# Patient Record
Sex: Male | Born: 1970
Health system: Southern US, Community
[De-identification: ages and names within clinical notes are randomized; demographics above are authoritative.]

## PROBLEM LIST (undated history)

## (undated) DIAGNOSIS — Z87442 Personal history of urinary calculi: Secondary | ICD-10-CM

## (undated) DIAGNOSIS — R079 Chest pain, unspecified: Secondary | ICD-10-CM

## (undated) DIAGNOSIS — N201 Calculus of ureter: Secondary | ICD-10-CM

## (undated) HISTORY — DX: Chest pain, unspecified: R07.9

## (undated) HISTORY — PX: CHOLECYSTECTOMY: SHX55

---

## 1993-07-18 HISTORY — PX: FOOT SURGERY: SHX648

## 2004-07-10 ENCOUNTER — Emergency Department: Payer: Self-pay | Admitting: Emergency Medicine

## 2006-07-18 HISTORY — PX: URETEROLITHOTOMY: SHX71

## 2007-07-04 ENCOUNTER — Ambulatory Visit: Payer: Self-pay | Admitting: Urology

## 2007-07-09 ENCOUNTER — Emergency Department: Payer: Self-pay | Admitting: Emergency Medicine

## 2007-07-11 ENCOUNTER — Ambulatory Visit: Payer: Self-pay | Admitting: Urology

## 2007-07-16 ENCOUNTER — Ambulatory Visit: Payer: Self-pay | Admitting: Urology

## 2013-12-16 ENCOUNTER — Other Ambulatory Visit: Payer: Self-pay | Admitting: Urology

## 2013-12-18 ENCOUNTER — Encounter (HOSPITAL_BASED_OUTPATIENT_CLINIC_OR_DEPARTMENT_OTHER): Payer: Self-pay | Admitting: *Deleted

## 2013-12-20 ENCOUNTER — Encounter (HOSPITAL_BASED_OUTPATIENT_CLINIC_OR_DEPARTMENT_OTHER): Payer: Self-pay | Admitting: *Deleted

## 2013-12-20 NOTE — Progress Notes (Signed)
NPO AFTER MN. ARRIVE AT 0600. NEEDS HG. MAY TAKE OXYCODONE IF NEEDED W/ SIPS OF WATER AM DOS.

## 2013-12-26 ENCOUNTER — Ambulatory Visit (HOSPITAL_BASED_OUTPATIENT_CLINIC_OR_DEPARTMENT_OTHER): Admission: RE | Admit: 2013-12-26 | Payer: BC Managed Care – PPO | Source: Ambulatory Visit | Admitting: Urology

## 2013-12-26 HISTORY — DX: Personal history of urinary calculi: Z87.442

## 2013-12-26 HISTORY — DX: Calculus of ureter: N20.1

## 2013-12-26 SURGERY — CYSTOURETEROSCOPY, WITH RETROGRADE PYELOGRAM AND STENT INSERTION
Anesthesia: General | Laterality: Right

## 2016-03-18 DIAGNOSIS — M5413 Radiculopathy, cervicothoracic region: Secondary | ICD-10-CM | POA: Diagnosis not present

## 2016-03-18 DIAGNOSIS — M9901 Segmental and somatic dysfunction of cervical region: Secondary | ICD-10-CM | POA: Diagnosis not present

## 2016-03-18 DIAGNOSIS — M9903 Segmental and somatic dysfunction of lumbar region: Secondary | ICD-10-CM | POA: Diagnosis not present

## 2016-03-18 DIAGNOSIS — M9902 Segmental and somatic dysfunction of thoracic region: Secondary | ICD-10-CM | POA: Diagnosis not present

## 2016-03-31 DIAGNOSIS — M9901 Segmental and somatic dysfunction of cervical region: Secondary | ICD-10-CM | POA: Diagnosis not present

## 2016-03-31 DIAGNOSIS — M9902 Segmental and somatic dysfunction of thoracic region: Secondary | ICD-10-CM | POA: Diagnosis not present

## 2016-03-31 DIAGNOSIS — M5413 Radiculopathy, cervicothoracic region: Secondary | ICD-10-CM | POA: Diagnosis not present

## 2016-03-31 DIAGNOSIS — M9903 Segmental and somatic dysfunction of lumbar region: Secondary | ICD-10-CM | POA: Diagnosis not present

## 2016-04-21 DIAGNOSIS — M9903 Segmental and somatic dysfunction of lumbar region: Secondary | ICD-10-CM | POA: Diagnosis not present

## 2016-04-21 DIAGNOSIS — M5413 Radiculopathy, cervicothoracic region: Secondary | ICD-10-CM | POA: Diagnosis not present

## 2016-04-21 DIAGNOSIS — M9901 Segmental and somatic dysfunction of cervical region: Secondary | ICD-10-CM | POA: Diagnosis not present

## 2016-04-21 DIAGNOSIS — M9902 Segmental and somatic dysfunction of thoracic region: Secondary | ICD-10-CM | POA: Diagnosis not present

## 2016-05-12 DIAGNOSIS — M9903 Segmental and somatic dysfunction of lumbar region: Secondary | ICD-10-CM | POA: Diagnosis not present

## 2016-05-12 DIAGNOSIS — M9902 Segmental and somatic dysfunction of thoracic region: Secondary | ICD-10-CM | POA: Diagnosis not present

## 2016-06-02 DIAGNOSIS — M9903 Segmental and somatic dysfunction of lumbar region: Secondary | ICD-10-CM | POA: Diagnosis not present

## 2016-06-02 DIAGNOSIS — M9902 Segmental and somatic dysfunction of thoracic region: Secondary | ICD-10-CM | POA: Diagnosis not present

## 2016-06-20 DIAGNOSIS — M546 Pain in thoracic spine: Secondary | ICD-10-CM | POA: Diagnosis not present

## 2016-06-20 DIAGNOSIS — M9902 Segmental and somatic dysfunction of thoracic region: Secondary | ICD-10-CM | POA: Diagnosis not present

## 2016-06-20 DIAGNOSIS — M9903 Segmental and somatic dysfunction of lumbar region: Secondary | ICD-10-CM | POA: Diagnosis not present

## 2016-07-14 DIAGNOSIS — M9903 Segmental and somatic dysfunction of lumbar region: Secondary | ICD-10-CM | POA: Diagnosis not present

## 2016-07-14 DIAGNOSIS — M9902 Segmental and somatic dysfunction of thoracic region: Secondary | ICD-10-CM | POA: Diagnosis not present

## 2016-07-14 DIAGNOSIS — M546 Pain in thoracic spine: Secondary | ICD-10-CM | POA: Diagnosis not present

## 2016-08-11 DIAGNOSIS — M9902 Segmental and somatic dysfunction of thoracic region: Secondary | ICD-10-CM | POA: Diagnosis not present

## 2016-08-11 DIAGNOSIS — M546 Pain in thoracic spine: Secondary | ICD-10-CM | POA: Diagnosis not present

## 2016-08-11 DIAGNOSIS — M9903 Segmental and somatic dysfunction of lumbar region: Secondary | ICD-10-CM | POA: Diagnosis not present

## 2016-09-08 DIAGNOSIS — M9902 Segmental and somatic dysfunction of thoracic region: Secondary | ICD-10-CM | POA: Diagnosis not present

## 2016-09-08 DIAGNOSIS — M9901 Segmental and somatic dysfunction of cervical region: Secondary | ICD-10-CM | POA: Diagnosis not present

## 2016-09-08 DIAGNOSIS — G44209 Tension-type headache, unspecified, not intractable: Secondary | ICD-10-CM | POA: Diagnosis not present

## 2016-09-08 DIAGNOSIS — M9903 Segmental and somatic dysfunction of lumbar region: Secondary | ICD-10-CM | POA: Diagnosis not present

## 2016-09-29 DIAGNOSIS — G44209 Tension-type headache, unspecified, not intractable: Secondary | ICD-10-CM | POA: Diagnosis not present

## 2016-09-29 DIAGNOSIS — M9902 Segmental and somatic dysfunction of thoracic region: Secondary | ICD-10-CM | POA: Diagnosis not present

## 2016-09-29 DIAGNOSIS — M9901 Segmental and somatic dysfunction of cervical region: Secondary | ICD-10-CM | POA: Diagnosis not present

## 2016-09-29 DIAGNOSIS — M9903 Segmental and somatic dysfunction of lumbar region: Secondary | ICD-10-CM | POA: Diagnosis not present

## 2016-10-27 DIAGNOSIS — M9901 Segmental and somatic dysfunction of cervical region: Secondary | ICD-10-CM | POA: Diagnosis not present

## 2016-10-27 DIAGNOSIS — M9902 Segmental and somatic dysfunction of thoracic region: Secondary | ICD-10-CM | POA: Diagnosis not present

## 2016-10-27 DIAGNOSIS — M9903 Segmental and somatic dysfunction of lumbar region: Secondary | ICD-10-CM | POA: Diagnosis not present

## 2016-12-16 HISTORY — PX: URETERAL STENT PLACEMENT: SHX822

## 2016-12-23 ENCOUNTER — Emergency Department
Admission: EM | Admit: 2016-12-23 | Discharge: 2016-12-23 | Disposition: A | Discharge status: Home / Self Care | Payer: BLUE CROSS/BLUE SHIELD | Attending: Emergency Medicine | Admitting: Emergency Medicine

## 2016-12-23 ENCOUNTER — Emergency Department: Payer: BLUE CROSS/BLUE SHIELD

## 2016-12-23 ENCOUNTER — Encounter: Payer: Self-pay | Admitting: Emergency Medicine

## 2016-12-23 DIAGNOSIS — Z87891 Personal history of nicotine dependence: Secondary | ICD-10-CM | POA: Diagnosis not present

## 2016-12-23 DIAGNOSIS — N200 Calculus of kidney: Secondary | ICD-10-CM | POA: Diagnosis not present

## 2016-12-23 DIAGNOSIS — R109 Unspecified abdominal pain: Secondary | ICD-10-CM | POA: Diagnosis not present

## 2016-12-23 DIAGNOSIS — R1011 Right upper quadrant pain: Secondary | ICD-10-CM | POA: Diagnosis present

## 2016-12-23 LAB — COMPREHENSIVE METABOLIC PANEL
ALT: 38 U/L (ref 17–63)
AST: 24 U/L (ref 15–41)
Albumin: 4.6 g/dL (ref 3.5–5.0)
Alkaline Phosphatase: 46 U/L (ref 38–126)
Anion gap: 8 (ref 5–15)
BUN: 18 mg/dL (ref 6–20)
CHLORIDE: 107 mmol/L (ref 101–111)
CO2: 25 mmol/L (ref 22–32)
Calcium: 9.4 mg/dL (ref 8.9–10.3)
Creatinine, Ser: 0.94 mg/dL (ref 0.61–1.24)
GFR calc Af Amer: >60 mL/min (ref >60)
GFR calc non Af Amer: >60 mL/min (ref >60)
GLUCOSE: 99 mg/dL (ref 65–99)
Potassium: 4 mmol/L (ref 3.5–5.1)
Sodium: 140 mmol/L (ref 135–145)
Total Bilirubin: 1.5 mg/dL — ABNORMAL HIGH (ref 0.3–1.2)
Total Protein: 7.6 g/dL (ref 6.5–8.1)

## 2016-12-23 LAB — CBC
HCT: 46.1 % (ref 40.0–52.0)
HEMOGLOBIN: 15.9 g/dL (ref 13.0–18.0)
MCH: 32.3 pg (ref 26.0–34.0)
MCHC: 34.4 g/dL (ref 32.0–36.0)
MCV: 93.8 fL (ref 80.0–100.0)
Platelets: 323 10*3/uL (ref 150–440)
RBC: 4.91 MIL/uL (ref 4.40–5.90)
RDW: 13.1 % (ref 11.5–14.5)
WBC: 6.5 10*3/uL (ref 3.8–10.6)

## 2016-12-23 MED ORDER — OXYCODONE-ACETAMINOPHEN 7.5-325 MG PO TABS: 1.0000 | ORAL_TABLET | ORAL | 0 refills | Status: DC | PRN

## 2016-12-23 MED ORDER — KETOROLAC TROMETHAMINE 30 MG/ML IJ SOLN
15.0000 mg | Freq: Once | INTRAMUSCULAR | Status: DC
Start: 2016-12-23 — End: 2016-12-23

## 2016-12-23 MED ORDER — KETOROLAC TROMETHAMINE 60 MG/2ML IM SOLN
INTRAMUSCULAR | Status: AC
  Filled 2016-12-23: qty 2

## 2016-12-23 MED ORDER — TAMSULOSIN HCL 0.4 MG PO CAPS
0.4000 mg | ORAL_CAPSULE | Freq: Once | ORAL | Status: AC
  Administered 2016-12-23: 0.4 mg via ORAL
  Filled 2016-12-23: qty 1

## 2016-12-23 MED ORDER — TAMSULOSIN HCL 0.4 MG PO CAPS: 0.4000 mg | ORAL_CAPSULE | Freq: Every day | ORAL | 0 refills | Status: DC

## 2016-12-23 MED ORDER — ONDANSETRON HCL 4 MG/2ML IJ SOLN
4.0000 mg | Freq: Once | INTRAMUSCULAR | Status: AC
  Administered 2016-12-23: 4 mg via INTRAVENOUS
  Filled 2016-12-23: qty 2

## 2016-12-23 MED ORDER — KETOROLAC TROMETHAMINE 30 MG/ML IJ SOLN
30.0000 mg | Freq: Once | INTRAMUSCULAR | Status: AC
  Administered 2016-12-23: 30 mg via INTRAVENOUS

## 2016-12-23 MED ORDER — SODIUM CHLORIDE 0.9 % IV BOLUS (SEPSIS): 1000.0000 mL | Freq: Once | INTRAVENOUS | Status: DC

## 2016-12-23 MED ORDER — MORPHINE SULFATE (PF) 4 MG/ML IV SOLN
4.0000 mg | Freq: Once | INTRAVENOUS | Status: AC
  Administered 2016-12-23: 4 mg via INTRAVENOUS
  Filled 2016-12-23: qty 1

## 2016-12-23 NOTE — ED Notes (Signed)
Pt reports unable to pee since this am, sharop pain in right flank, reports non-moving pain in lower back on right

## 2016-12-23 NOTE — ED Provider Notes (Signed)
[facility] Emergency Department Provider Note  ____________________________________________   First MD Initiated Contact with Patient 12/23/16 1537     (approximate)  I have reviewed the triage vital signs and the nursing notes.   HISTORY  Chief Complaint Flank Pain   HPI John Bauer is a 46 y.o. male who presents to the emergency with severe stabbing sudden onset right flank pain radiating to his groin that began at 1 PM. He says this feels identical to previous kidney stones. [facility] and was sent to the emergency department for further evaluation. He denies fevers or chills. He denies dysuria. He has had a cholecystectomy and has required lithotripsy in the past.   Past Medical History:  Diagnosis Date  . History of kidney stones   . Right ureteral stone     There are no active problems to display for this patient.   Past Surgical History:  Procedure Laterality Date  . CHOLECYSTECTOMY  age 46's  . URETEROLITHOTOMY  2008    Prior to Admission medications   Medication Sig Start Date End Date Taking? Authorizing Provider  oxyCODONE-acetaminophen (PERCOCET) 7.5-325 MG tablet Take 1 tablet by mouth every 4 (four) hours as needed for severe pain. 12/23/16 12/23/17  Merrily Brittleifenbark, Amarise Lillo, MD  tamsulosin (FLOMAX) 0.4 MG CAPS capsule Take 1 capsule (0.4 mg total) by mouth daily. 12/23/16   Merrily Brittleifenbark, Delancey Moraes, MD    Allergies Patient has no known allergies.  No family history on file.  Social History Social History  Substance Use Topics  . Smoking status: Former Smoker    Packs/day: 1.00    Years: 5.00    Types: Cigarettes    Quit date: 12/20/1993  . Smokeless tobacco: Former NeurosurgeonUser    Types: Snuff    Quit date: 12/21/2011  . Alcohol use 2.0 oz/week    4 drink(s) per week    Review of Systems Constitutional: No fevers or chills Eyes: No visual changes. ENT: No sore throat. Cardiovascular: Denies chest pain. Respiratory:  Denies shortness of breath. Gastrointestinal: Positive abdominal pain.  Positive nausea, no vomiting.  No diarrhea.  No constipation. Genitourinary: Negative for dysuria. Musculoskeletal: Positive for back pain. Skin: Negative for rash. Neurological: Negative for headaches, focal weakness or numbness.  ____________________________________________   PHYSICAL EXAM:  VITAL SIGNS: ED Triage Vitals  Enc Vitals Group     BP 12/23/16 1425 (!) 146/90     Pulse Rate 12/23/16 1425 83     Resp 12/23/16 1425 18     Temp 12/23/16 1425 98.1 F (36.7 C)     Temp Source 12/23/16 1425 Oral     SpO2 12/23/16 1425 99 %     Weight 12/23/16 1421 245 lb (111.1 kg)     Height 12/23/16 1421 5\' 10"  (1.778 m)     Head Circumference --      Peak Flow --      Pain Score 12/23/16 1421 10     Pain Loc --      Pain Edu? --      Excl. in GC? --     Constitutional: Alert and oriented x 4 well appearing nontoxic no diaphoresis speaks in full, clear sentences Head: Atraumatic. Nose: No congestion/rhinnorhea. Mouth/Throat: No trismus Neck: No stridor.   Cardiovascular: Regular rate and rhythm no murmurs Respiratory: Normal respiratory effort.  No retractions. Gastrointestinal: Soft nontender no costovertebral tenderness Neurologic:  Normal speech and language. No gross focal neurologic deficits are appreciated.  Skin:  Skin is warm, dry and intact. No rash noted.    ____________________________________________  LABS (all labs ordered are listed, but only abnormal results are displayed)  Labs Reviewed  COMPREHENSIVE METABOLIC PANEL - Abnormal; Notable for the following:       Result Value   Total Bilirubin 1.5 (*)    All other components within normal limits  CBC    Labs unremarkable __________________________________________  EKG   ____________________________________________  RADIOLOGY  CT scan shows right-sided 5 mm  stone ____________________________________________   PROCEDURES  Procedure(s) performed: no  Procedures  Critical Care performed: no  Observation: no ____________________________________________   INITIAL IMPRESSION / ASSESSMENT AND PLAN / ED COURSE  Pertinent labs & imaging results that were available during my care of the patient were reviewed by me and considered in my medical decision making (see chart for details).  The patient is certain onset right flank pain that feels like his previous kidney stones. I discussed the need for imaging or no  and the patient would prefer imaging today which I think is reasonable. His CT scan does show a distal right sided kidney stone. I will prescribe her Flomax and have him follow-up with his urologist as an outpatient. He declines further pain medication this time.     ____________________________________________   FINAL CLINICAL IMPRESSION(S) / ED DIAGNOSES  Final diagnoses:  Kidney stone      NEW MEDICATIONS STARTED DURING THIS VISIT:  Discharge Medication List as of 12/23/2016  4:29 PM    START taking these medications   Details  oxyCODONE-acetaminophen (PERCOCET) 7.5-325 MG tablet Take 1 tablet by mouth every 4 (four) hours as needed for severe pain., Starting Fri 12/23/2016, Until Sat 12/23/2017, Print         Note:  This document was prepared using Dragon voice recognition software and may include unintentional dictation errors.      Merrily Brittle, MD 12/24/16 [phone]

## 2016-12-23 NOTE — Discharge Instructions (Signed)
The most important thing to know with kidney stones is that if you develop a fever or chills this could be life-threatening and it means he needs to come back to the emergency department immediately. He can take up to a week to pass your Stone. Please follow-up with a urologist as needed and return to the emergency department for any concerns.  It was a pleasure to take care of you today, and thank you for coming to our emergency department.  If you have any questions or concerns before leaving please ask the nurse to grab me and I'm more than happy to go through your aftercare instructions again.  If you were prescribed any opioid pain medication today such as Norco, Vicodin, Percocet, morphine, hydrocodone, or oxycodone please make sure you do not drive when you are taking this medication as it can alter your ability to drive safely.  If you have any concerns once you are home that you are not improving or are in fact getting worse before you can make it to your follow-up appointment, please do not hesitate to call 911 and come back for further evaluation.  Merrily Brittle MD  Results for orders placed or performed during the hospital encounter of 12/23/16  CBC  Result Value Ref Range   WBC 6.5 3.8 - 10.6 K/uL   RBC 4.91 4.40 - 5.90 MIL/uL   Hemoglobin 15.9 13.0 - 18.0 g/dL   HCT 91.4 78.2 - 95.6 %   MCV 93.8 80.0 - 100.0 fL   MCH 32.3 26.0 - 34.0 pg   MCHC 34.4 32.0 - 36.0 g/dL   RDW 21.3 08.6 - 57.8 %   Platelets 323 150 - 440 K/uL  Comprehensive metabolic panel  Result Value Ref Range   Sodium 140 135 - 145 mmol/L   Potassium 4.0 3.5 - 5.1 mmol/L   Chloride 107 101 - 111 mmol/L   CO2 25 22 - 32 mmol/L   Glucose, Bld 99 65 - 99 mg/dL   BUN 18 6 - 20 mg/dL   Creatinine, Ser 4.69 0.61 - 1.24 mg/dL   Calcium 9.4 8.9 - 62.9 mg/dL   Total Protein 7.6 6.5 - 8.1 g/dL   Albumin 4.6 3.5 - 5.0 g/dL   AST 24 15 - 41 U/L   ALT 38 17 - 63 U/L   Alkaline Phosphatase 46 38 - 126 U/L   Total  Bilirubin 1.5 (H) 0.3 - 1.2 mg/dL   GFR calc non Af Amer >60 >60 mL/min   GFR calc Af Amer >60 >60 mL/min   Anion gap 8 5 - 15   Ct Renal Stone Study  Result Date: 12/23/2016 CLINICAL DATA:  Worsening right flank pain for 1 day. Nephrolithiasis. EXAM: CT ABDOMEN AND PELVIS WITHOUT CONTRAST TECHNIQUE: Multidetector CT imaging of the abdomen and pelvis was performed following the standard protocol without IV contrast. COMPARISON:  12/13/2013 FINDINGS: Lower chest: No acute findings. Hepatobiliary: No masses visualized on this unenhanced exam. Prior cholecystectomy noted. No evidence of biliary dilatation. Pancreas: No mass or inflammatory process visualized on this unenhanced exam. Spleen:  Within normal limits in size. Adrenals/Urinary tract: Multiple small less than 5 mm calculi are seen in both kidneys. A 5 mm calculus is seen in the distal right ureter, however there is no evidence of hydroureteronephrosis. No left ureteral calculi or bladder calculi identified. Stomach/Bowel: No evidence of obstruction, inflammatory process, or abnormal fluid collections. Normal appendix visualized. Normal appendix visualized. Vascular/Lymphatic: No pathologically enlarged lymph nodes identified. No  evidence of abdominal aortic aneurysm. Reproductive:  No mass or other significant abnormality. Other:  None. Musculoskeletal:  No suspicious bone lesions identified. IMPRESSION: 5 mm distal right ureteral calculus. No evidence of right hydronephrosis or ureteral dilatation. Bilateral nonobstructing renal calculi. Electronically Signed   By: Myles RosenthalJohn  Stahl M.D.   On: 12/23/2016 16:10

## 2016-12-23 NOTE — ED Notes (Signed)
Pt found in room att 

## 2016-12-30 DIAGNOSIS — N201 Calculus of ureter: Secondary | ICD-10-CM | POA: Diagnosis not present

## 2016-12-30 DIAGNOSIS — N23 Unspecified renal colic: Secondary | ICD-10-CM | POA: Diagnosis not present

## 2017-01-02 NOTE — H&P (Signed)
NAME:  John Bauer Bauer, John Bauer                  ACCOUNT NO.:  MEDICAL RECORD NO.:  [id number]  LOCATION:                                 FACILITY:  PHYSICIAN:  Anola GurneyMichael Wolff          DATE OF BIRTH:  [birth year]  DATE OF ADMISSION: DATE OF DISCHARGE:                            HISTORY AND PHYSICAL   DATE OF SURGERY:  January 03, 2017.  CHIEF COMPLAINT:  Renal colic.  HISTORY OF PRESENT ILLNESS:  [name] Bauer is a 46 year old white male, who presented to the emergency room with severe right flank pain associated with nausea and vomiting on June 8.  He was found have a distal right ureteral stone without hydronephrosis.  The pain became severe again on June 15 and he required more analgesia and nausea medication.  Stone was still present in the distal ureter on KUB and measured 7 x 4 mm.  He comes in now for right ureteroscopic ureterolithotomy with holmium laser lithotripsy.  ALLERGIES:  NO DRUG ALLERGIES.  CURRENT MEDICATIONS:  Include oxycodone, Phenergan, Zofran, Nucynta, and tamsulosin.  SURGICAL HISTORY: 1. Cholecystectomy. 2. Right foot surgery. 3. Left ureteroscopic ureterolithotomy in 2008.  SOCIAL HISTORY:  The patient denied tobacco use.  He consumes alcohol socially.  FAMILY HISTORY:  Negative for urological disease.  PAST AND CURRENT MEDICAL CONDITIONS:  Kidney stone disease.  History of calcium oxalate stones.  REVIEW OF SYSTEMS:  The patient denied chest pain, shortness of breath, diabetes, stroke, or hypertension.  PHYSICAL EXAMINATION:  GENERAL:  Well-nourished white male in no acute distress. HEENT:  Sclerae were clear.  Pupils were equally round, reactive to light and accommodation. NECK:  Supple.  No palpable cervical adenopathy. LUNGS:  Clear to auscultation. CARDIOVASCULAR:  Regular rhythm and rate without audible murmurs. ABDOMEN:  Soft and nontender abdomen. GU AND RECTAL:  Deferred. NEUROMUSCULAR:  Alert and oriented x3.  IMPRESSION:  Right  ureterolithiasis.  PLAN:  Right ureteroscopic ureterolithotomy with holmium laser lithotripsy.          ______________________________ Anola GurneyMichael Wolff     MW/MEDQ  D:  12/30/2016  T:  12/30/2016  Job:  [id number]

## 2017-01-02 NOTE — H&P (Deleted)
  The note originally documented on this encounter has been moved the the encounter in which it belongs.  

## 2017-01-03 ENCOUNTER — Encounter
Admission: RE | Disposition: A | Discharge status: Home / Self Care | Payer: Self-pay | Source: Ambulatory Visit | Attending: Urology

## 2017-01-03 ENCOUNTER — Ambulatory Visit: Payer: BLUE CROSS/BLUE SHIELD | Admitting: Anesthesiology

## 2017-01-03 ENCOUNTER — Ambulatory Visit
Admission: RE | Admit: 2017-01-03 | Discharge: 2017-01-03 | Disposition: A | Discharge status: Home / Self Care | Payer: BLUE CROSS/BLUE SHIELD | Source: Ambulatory Visit | Attending: Urology | Admitting: Urology

## 2017-01-03 ENCOUNTER — Encounter: Payer: Self-pay | Admitting: *Deleted

## 2017-01-03 DIAGNOSIS — Z87891 Personal history of nicotine dependence: Secondary | ICD-10-CM | POA: Insufficient documentation

## 2017-01-03 DIAGNOSIS — N201 Calculus of ureter: Secondary | ICD-10-CM | POA: Diagnosis not present

## 2017-01-03 DIAGNOSIS — N209 Urinary calculus, unspecified: Secondary | ICD-10-CM | POA: Diagnosis not present

## 2017-01-03 HISTORY — PX: URETEROSCOPY WITH HOLMIUM LASER LITHOTRIPSY: SHX6645

## 2017-01-03 HISTORY — PX: HOLMIUM LASER APPLICATION: SHX5852

## 2017-01-03 SURGERY — URETEROSCOPY, WITH LITHOTRIPSY USING HOLMIUM LASER
Anesthesia: General | Laterality: Right

## 2017-01-03 MED ORDER — NUCYNTA 50 MG PO TABS: 50.0000 mg | ORAL_TABLET | Freq: Four times a day (QID) | ORAL | 0 refills | Status: DC | PRN

## 2017-01-03 MED ORDER — LACTATED RINGERS IV SOLN
INTRAVENOUS | Status: DC | PRN
  Administered 2017-01-03: 15:00:00 via INTRAVENOUS

## 2017-01-03 MED ORDER — CEFAZOLIN SODIUM-DEXTROSE 1-4 GM/50ML-% IV SOLN
INTRAVENOUS | Status: DC | PRN
  Administered 2017-01-03: 1 g via INTRAVENOUS

## 2017-01-03 MED ORDER — FENTANYL CITRATE (PF) 100 MCG/2ML IJ SOLN
INTRAMUSCULAR | Status: AC
Start: 2017-01-03 — End: 2017-01-03
  Filled 2017-01-03: qty 2

## 2017-01-03 MED ORDER — LIDOCAINE HCL (CARDIAC) 20 MG/ML IV SOLN
INTRAVENOUS | Status: DC | PRN
  Administered 2017-01-03: 50 mg via INTRAVENOUS

## 2017-01-03 MED ORDER — BELLADONNA ALKALOIDS-OPIUM 16.2-60 MG RE SUPP
RECTAL | Status: DC | PRN
  Administered 2017-01-03: 1 via RECTAL

## 2017-01-03 MED ORDER — KETOROLAC TROMETHAMINE 30 MG/ML IJ SOLN
INTRAMUSCULAR | Status: DC | PRN
  Administered 2017-01-03: 30 mg via INTRAVENOUS

## 2017-01-03 MED ORDER — HYOSCYAMINE SULFATE SL 0.125 MG SL SUBL: 0.1250 mg | SUBLINGUAL_TABLET | SUBLINGUAL | 2 refills | Status: DC | PRN

## 2017-01-03 MED ORDER — ONDANSETRON 8 MG PO TBDP: 8.0000 mg | ORAL_TABLET | Freq: Four times a day (QID) | ORAL | 3 refills | Status: DC | PRN

## 2017-01-03 MED ORDER — LEVOFLOXACIN 500 MG PO TABS: 500.0000 mg | ORAL_TABLET | Freq: Every day | ORAL | 1 refills | Status: DC

## 2017-01-03 MED ORDER — ONDANSETRON HCL 4 MG/2ML IJ SOLN
INTRAMUSCULAR | Status: AC
Start: 2017-01-03 — End: 2017-01-03
  Filled 2017-01-03: qty 2

## 2017-01-03 MED ORDER — MIDAZOLAM HCL 2 MG/2ML IJ SOLN
INTRAMUSCULAR | Status: AC
Start: 2017-01-03 — End: 2017-01-03
  Filled 2017-01-03: qty 2

## 2017-01-03 MED ORDER — LIDOCAINE HCL 2 % EX GEL
CUTANEOUS | Status: AC
Start: 2017-01-03 — End: 2017-01-03
  Filled 2017-01-03: qty 10

## 2017-01-03 MED ORDER — ONDANSETRON HCL 4 MG/2ML IJ SOLN
INTRAMUSCULAR | Status: DC | PRN
  Administered 2017-01-03: 4 mg via INTRAVENOUS

## 2017-01-03 MED ORDER — CEFAZOLIN SODIUM-DEXTROSE 1-4 GM/50ML-% IV SOLN
INTRAVENOUS | Status: AC
  Filled 2017-01-03: qty 50

## 2017-01-03 MED ORDER — LIDOCAINE HCL 2 % EX GEL
CUTANEOUS | Status: DC | PRN
  Administered 2017-01-03: 1 via URETHRAL

## 2017-01-03 MED ORDER — FENTANYL CITRATE (PF) 100 MCG/2ML IJ SOLN
INTRAMUSCULAR | Status: DC | PRN
  Administered 2017-01-03 (×2): 50 ug via INTRAVENOUS

## 2017-01-03 MED ORDER — PROPOFOL 10 MG/ML IV BOLUS
INTRAVENOUS | Status: AC
  Filled 2017-01-03: qty 20

## 2017-01-03 MED ORDER — PROPOFOL 10 MG/ML IV BOLUS
INTRAVENOUS | Status: DC | PRN
  Administered 2017-01-03: 150 mg via INTRAVENOUS
  Administered 2017-01-03: 50 mg via INTRAVENOUS

## 2017-01-03 MED ORDER — MIDAZOLAM HCL 2 MG/2ML IJ SOLN
INTRAMUSCULAR | Status: DC | PRN
  Administered 2017-01-03: 2 mg via INTRAVENOUS

## 2017-01-03 MED ORDER — LACTATED RINGERS IV SOLN
Freq: Once | INTRAVENOUS | Status: AC
  Administered 2017-01-03: 13:00:00 via INTRAVENOUS

## 2017-01-03 MED ORDER — ONDANSETRON HCL 4 MG/2ML IJ SOLN: 4.0000 mg | Freq: Once | INTRAMUSCULAR | Status: DC | PRN

## 2017-01-03 MED ORDER — BELLADONNA ALKALOIDS-OPIUM 16.2-60 MG RE SUPP
RECTAL | Status: AC
  Filled 2017-01-03: qty 1

## 2017-01-03 MED ORDER — ONDANSETRON HCL 4 MG/2ML IJ SOLN: 4.0000 mg | Freq: Once | INTRAMUSCULAR | 0 refills | Status: DC | PRN

## 2017-01-03 MED ORDER — FUROSEMIDE 10 MG/ML IJ SOLN
INTRAMUSCULAR | Status: AC
  Filled 2017-01-03: qty 4

## 2017-01-03 MED ORDER — FENTANYL CITRATE (PF) 100 MCG/2ML IJ SOLN: 25.0000 ug | INTRAMUSCULAR | Status: DC | PRN

## 2017-01-03 MED ORDER — FUROSEMIDE 10 MG/ML IJ SOLN
INTRAMUSCULAR | Status: DC | PRN
  Administered 2017-01-03: 10 mg via INTRAMUSCULAR

## 2017-01-03 MED ORDER — KETOROLAC TROMETHAMINE 30 MG/ML IJ SOLN
INTRAMUSCULAR | Status: AC
  Filled 2017-01-03: qty 1

## 2017-01-03 SURGICAL SUPPLY — 24 items
BAG DRAIN CYSTO-URO LG1000N (MISCELLANEOUS) ×2 IMPLANT
CATH URETL 5X70 OPEN END (CATHETERS) ×2 IMPLANT
CNTNR SPEC 2.5X3XGRAD LEK (MISCELLANEOUS)
CONRAY 43 FOR UROLOGY 50M (MISCELLANEOUS) ×2 IMPLANT
CONT SPEC 4OZ STER OR WHT (MISCELLANEOUS)
CONTAINER SPEC 2.5X3XGRAD LEK (MISCELLANEOUS) IMPLANT
FEE TECHNICIAN ONLY PER HOUR (MISCELLANEOUS) ×2 IMPLANT
FIBER LASER 365 (Laser) IMPLANT
FIBER LASER 550 (Laser) ×2 IMPLANT
GLOVE BIO SURGEON STRL SZ7 (GLOVE) ×4 IMPLANT
GOWN STRL REUS W/ TWL LRG LVL4 (GOWN DISPOSABLE) ×1 IMPLANT
GOWN STRL REUS W/TWL LRG LVL4 (GOWN DISPOSABLE) ×1
GOWN STRL REUS W/TWL XL LVL4 (GOWN DISPOSABLE) ×2 IMPLANT
GUIDEWIRE STR ZIPWIRE 035X150 (MISCELLANEOUS) ×2 IMPLANT
KIT RM TURNOVER CYSTO AR (KITS) ×2 IMPLANT
PACK CYSTO AR (MISCELLANEOUS) ×2 IMPLANT
SET CYSTO W/LG BORE CLAMP LF (SET/KITS/TRAYS/PACK) ×2 IMPLANT
SOL .9 NS 3000ML IRR  AL (IV SOLUTION) ×1
SOL .9 NS 3000ML IRR UROMATIC (IV SOLUTION) ×1 IMPLANT
SOL PREP PVP 2OZ (MISCELLANEOUS) ×2
SOLUTION PREP PVP 2OZ (MISCELLANEOUS) ×1 IMPLANT
STENT URET 6FRX26 CONTOUR (STENTS) ×2 IMPLANT
SURGILUBE 2OZ TUBE FLIPTOP (MISCELLANEOUS) ×2 IMPLANT
WATER STERILE IRR 1000ML POUR (IV SOLUTION) ×2 IMPLANT

## 2017-01-03 NOTE — Transfer of Care (Signed)
Immediate Anesthesia Transfer of Care Note  Patient: John Bauer  Procedure(s) Performed: Procedure(s): URETEROSCOPY WITH HOLMIUM LASER LITHOTRIPSY (Right) HOLMIUM LASER APPLICATION (Right)  Patient Location: PACU  Anesthesia Type:General  Level of Consciousness: awake, alert  and oriented  Airway & Oxygen Therapy: Patient Spontanous Breathing and Patient connected to face mask oxygen  Post-op Assessment: Report given to RN and Post -op Vital signs reviewed and stable  Post vital signs: Reviewed and stable  Last Vitals:  Vitals:   01/03/17 1250 01/03/17 1545  BP: (!) 130/93 134/89  Pulse: 84   Resp: 18   Temp: 36.9 C 36.6 C    Last Pain:  Vitals:   01/03/17 1250  TempSrc: Oral  PainSc: 6       Patients Stated Pain Goal: 2 (01/03/17 1250)  Complications: No apparent anesthesia complications

## 2017-01-03 NOTE — Discharge Instructions (Addendum)
Follow instructions given by Dr. Evelene CroonWolff.  General Anesthesia, Adult, Care After These instructions provide you with information about caring for yourself after your procedure. Your health care provider may also give you more specific instructions. Your treatment has been planned according to current medical practices, but problems sometimes occur. Call your health care provider if you have any problems or questions after your procedure. What can I expect after the procedure? After the procedure, it is common to have:  Vomiting.  A sore throat.  Mental slowness.  It is common to feel:  Nauseous.  Cold or shivery.  Sleepy.  Tired.  Sore or achy, even in parts of your body where you did not have surgery.  Follow these instructions at home: For at least 24 hours after the procedure:  Do not: ? Participate in activities where you could fall or become injured. ? Drive. ? Use heavy machinery. ? Drink alcohol. ? Take sleeping pills or medicines that cause drowsiness. ? Make important decisions or sign legal documents. ? Take care of children on your own.  Rest. Eating and drinking  If you vomit, drink water, juice, or soup when you can drink without vomiting.  Drink enough fluid to keep your urine clear or pale yellow.  Make sure you have little or no nausea before eating solid foods.  Follow the diet recommended by your health care provider. General instructions  Have a responsible adult stay with you until you are awake and alert.  Return to your normal activities as told by your health care provider. Ask your health care provider what activities are safe for you.  Take over-the-counter and prescription medicines only as told by your health care provider.  If you smoke, do not smoke without supervision.  Keep all follow-up visits as told by your health care provider. This is important. Contact a health care provider if:  You continue to have nausea or vomiting at  home, and medicines are not helpful.  You cannot drink fluids or start eating again.  You cannot urinate after 8-12 hours.  You develop a skin rash.  You have fever.  You have increasing redness at the site of your procedure. Get help right away if:  You have difficulty breathing.  You have chest pain.  You have unexpected bleeding.  You feel that you are having a life-threatening or urgent problem. This information is not intended to replace advice given to you by your health care provider. Make sure you discuss any questions you have with your health care provider. Document Released: 10/10/2000 Document Revised: 12/07/2015 Document Reviewed: 06/18/2015 Elsevier Interactive Patient Education  [employer]2018 [employer]

## 2017-01-03 NOTE — Anesthesia Preprocedure Evaluation (Signed)
Anesthesia Evaluation  Patient identified by MRN, date of birth, ID band Patient awake    Reviewed: Allergy & Precautions, NPO status , Patient's Chart, lab work & pertinent test results  History of Anesthesia Complications Negative for: history of anesthetic complications  Airway Mallampati: II       Dental   Pulmonary neg pulmonary ROS, former smoker,           Cardiovascular negative cardio ROS       Neuro/Psych negative neurological ROS  negative psych ROS   GI/Hepatic negative GI ROS, Neg liver ROS,   Endo/Other  negative endocrine ROS  Renal/GU negative Renal ROS     Musculoskeletal   Abdominal   Peds  Hematology   Anesthesia Other Findings   Reproductive/Obstetrics                             Anesthesia Physical Anesthesia Plan  ASA: II  Anesthesia Plan: General   Post-op Pain Management:    Induction:   PONV Risk Score and Plan: 2 and Ondansetron and Dexamethasone  Airway Management Planned: LMA  Additional Equipment:   Intra-op Plan:   Post-operative Plan:   Informed Consent: I have reviewed the patients History and Physical, chart, labs and discussed the procedure including the risks, benefits and alternatives for the proposed anesthesia with the patient or authorized representative who has indicated his/her understanding and acceptance.     Plan Discussed with:   Anesthesia Plan Comments:         Anesthesia Quick Evaluation

## 2017-01-03 NOTE — Anesthesia Procedure Notes (Signed)
Procedure Name: LMA Insertion Date/Time: 01/03/2017 2:46 PM Performed by: Omer JackWEATHERLY, Meklit Cotta Pre-anesthesia Checklist: Patient identified, Patient being monitored, Timeout performed, Emergency Drugs available and Suction available Patient Re-evaluated:Patient Re-evaluated prior to inductionOxygen Delivery Method: Circle system utilized Preoxygenation: Pre-oxygenation with 100% oxygen Intubation Type: IV induction Ventilation: Mask ventilation without difficulty LMA: LMA inserted LMA Size: 5.0 Tube type: Oral Number of attempts: 1 Placement Confirmation: positive ETCO2 and breath sounds checked- equal and bilateral Tube secured with: Tape Dental Injury: Teeth and Oropharynx as per pre-operative assessment

## 2017-01-03 NOTE — Anesthesia Post-op Follow-up Note (Cosign Needed)
Anesthesia QCDR form completed.        

## 2017-01-03 NOTE — Op Note (Signed)
Preoperative diagnosis: Right ureterolithiasis  Postoperative diagnosis: Same   Procedure: 1. Right ureteroscopic ureterolithotomy with holmium laser lithotripsy                      2. Fluoroscopy                      3. Right double pigtail stent placement    Surgeon: Suszanne ConnersMichael R. Evelene CroonWolff MD  Anesthesia: General  Indications:See the history and physical. After informed consent the above procedure(s) were requested     Technique and findings: After adequate general anesthesia been obtained the patient was placed into dorsal lithotomy position and the perineum was prepped and draped in the usual fashion. Fluoroscopy confirmed the presence of a 4 x 10 mm stone in the distal right ureter. The short mini rigid ureteroscope was coupled the camera and visually advanced into the distal ureter. The stone was identified and the 550  holmium laser fiber was advanced to the stone. Frequency was set at 5 and power set at 0.4. The stone was then pulverized. The ureteroscope was removed. The cystoscope was visually advanced into the bladder and a 0.035 Glidewire advanced up the right ureter using fluoroscopic guidance. A 6 x 26 cm pigtail stent with retrieval suture attached was advanced over the guidewire and position in the ureter. The guidewire was then removed taking care to leave the stent in position. The bladder was drained and cystoscope was removed. 10 cc of viscous Xylocaine was instilled within the urethra. A B&O suppository placed. The procedure was then terminated and patient transferred to the recovery room in stable condition.

## 2017-01-03 NOTE — H&P (Signed)
Date of Initial H&P: 12/30/16  History reviewed, patient examined, no change in status, stable for surgery.

## 2017-01-03 NOTE — Anesthesia Postprocedure Evaluation (Signed)
Anesthesia Post Note  Patient: John Bauer  Procedure(s) Performed: Procedure(s) (LRB): URETEROSCOPY WITH HOLMIUM LASER LITHOTRIPSY (Right) HOLMIUM LASER APPLICATION (Right)  Patient location during evaluation: PACU Anesthesia Type: General Level of consciousness: awake and alert and oriented Pain management: pain level controlled Vital Signs Assessment: post-procedure vital signs reviewed and stable Respiratory status: spontaneous breathing, nonlabored ventilation and respiratory function stable Cardiovascular status: blood pressure returned to baseline and stable Postop Assessment: no signs of nausea or vomiting Anesthetic complications: no     Last Vitals:  Vitals:   01/03/17 1615 01/03/17 1628  BP: 139/90 (!) 145/82  Pulse: 66 (!) 55  Resp: 12 14  Temp: 36.4 C 36.1 C    Last Pain:  Vitals:   01/03/17 1628  TempSrc: Temporal  PainSc: 0-No pain                 Trinty Marken

## 2017-01-04 ENCOUNTER — Encounter: Payer: Self-pay | Admitting: Urology

## 2017-01-09 DIAGNOSIS — N200 Calculus of kidney: Secondary | ICD-10-CM | POA: Diagnosis not present

## 2017-01-09 DIAGNOSIS — N201 Calculus of ureter: Secondary | ICD-10-CM | POA: Diagnosis not present

## 2017-01-24 DIAGNOSIS — N200 Calculus of kidney: Secondary | ICD-10-CM | POA: Diagnosis not present

## 2017-01-24 DIAGNOSIS — N201 Calculus of ureter: Secondary | ICD-10-CM | POA: Diagnosis not present

## 2017-01-24 DIAGNOSIS — N23 Unspecified renal colic: Secondary | ICD-10-CM | POA: Diagnosis not present

## 2017-01-25 ENCOUNTER — Encounter: Payer: Self-pay | Admitting: *Deleted

## 2017-01-26 ENCOUNTER — Encounter
Admission: RE | Disposition: A | Discharge status: Home / Self Care | Payer: Self-pay | Source: Ambulatory Visit | Attending: Urology

## 2017-01-26 ENCOUNTER — Ambulatory Visit
Admission: RE | Admit: 2017-01-26 | Discharge: 2017-01-26 | Disposition: A | Discharge status: Home / Self Care | Payer: BLUE CROSS/BLUE SHIELD | Source: Ambulatory Visit | Attending: Urology | Admitting: Urology

## 2017-01-26 DIAGNOSIS — N201 Calculus of ureter: Secondary | ICD-10-CM

## 2017-01-26 DIAGNOSIS — N200 Calculus of kidney: Secondary | ICD-10-CM | POA: Diagnosis not present

## 2017-01-26 DIAGNOSIS — Z7982 Long term (current) use of aspirin: Secondary | ICD-10-CM | POA: Insufficient documentation

## 2017-01-26 HISTORY — PX: EXTRACORPOREAL SHOCK WAVE LITHOTRIPSY: SHX1557

## 2017-01-26 SURGERY — LITHOTRIPSY, ESWL
Anesthesia: Moderate Sedation | Laterality: Right

## 2017-01-26 MED ORDER — PROMETHAZINE HCL 25 MG/ML IJ SOLN
25.0000 mg | Freq: Four times a day (QID) | INTRAMUSCULAR | Status: DC | PRN
  Administered 2017-01-26: 25 mg via INTRAMUSCULAR
  Filled 2017-01-26 (×2): qty 1

## 2017-01-26 MED ORDER — NUCYNTA 50 MG PO TABS: 50.0000 mg | ORAL_TABLET | Freq: Four times a day (QID) | ORAL | 0 refills | Status: DC | PRN

## 2017-01-26 MED ORDER — TAMSULOSIN HCL 0.4 MG PO CAPS: 0.4000 mg | ORAL_CAPSULE | Freq: Every day | ORAL | 0 refills | Status: DC

## 2017-01-26 MED ORDER — SULFAMETHOXAZOLE-TRIMETHOPRIM 800-160 MG PO TABS: 1.0000 | ORAL_TABLET | Freq: Two times a day (BID) | ORAL | 0 refills | Status: DC

## 2017-01-26 MED ORDER — FUROSEMIDE 10 MG/ML IJ SOLN
10.0000 mg | Freq: Once | INTRAMUSCULAR | Status: AC
  Administered 2017-01-26: 10 mg via INTRAVENOUS

## 2017-01-26 MED ORDER — DIPHENHYDRAMINE HCL 25 MG PO CAPS
25.0000 mg | ORAL_CAPSULE | ORAL | Status: AC
  Administered 2017-01-26: 25 mg via ORAL

## 2017-01-26 MED ORDER — LEVOFLOXACIN 500 MG PO TABS
ORAL_TABLET | ORAL | Status: AC
  Administered 2017-01-26: 500 mg via ORAL
  Filled 2017-01-26: qty 1

## 2017-01-26 MED ORDER — FUROSEMIDE 10 MG/ML IJ SOLN
INTRAMUSCULAR | Status: AC
  Filled 2017-01-26: qty 2

## 2017-01-26 MED ORDER — ONDANSETRON 8 MG PO TBDP: 4.0000 mg | ORAL_TABLET | Freq: Four times a day (QID) | ORAL | 3 refills | Status: DC | PRN

## 2017-01-26 MED ORDER — LEVOFLOXACIN 500 MG PO TABS
500.0000 mg | ORAL_TABLET | Freq: Once | ORAL | Status: AC
  Administered 2017-01-26: 500 mg via ORAL

## 2017-01-26 MED ORDER — MIDAZOLAM HCL 2 MG/2ML IJ SOLN
INTRAMUSCULAR | Status: AC
  Administered 2017-01-26: 1 mg via INTRAMUSCULAR
  Filled 2017-01-26: qty 2

## 2017-01-26 MED ORDER — DIPHENHYDRAMINE HCL 50 MG/ML IJ SOLN
INTRAMUSCULAR | Status: AC
  Filled 2017-01-26: qty 1

## 2017-01-26 MED ORDER — MORPHINE SULFATE (PF) 10 MG/ML IV SOLN
INTRAVENOUS | Status: AC
  Administered 2017-01-26: 10 mg via INTRAMUSCULAR
  Filled 2017-01-26: qty 1

## 2017-01-26 MED ORDER — MIDAZOLAM HCL 2 MG/2ML IJ SOLN
1.0000 mg | Freq: Once | INTRAMUSCULAR | Status: AC
  Administered 2017-01-26: 1 mg via INTRAMUSCULAR

## 2017-01-26 MED ORDER — DEXTROSE-NACL 5-0.45 % IV SOLN: INTRAVENOUS | Status: DC

## 2017-01-26 MED ORDER — MORPHINE SULFATE (PF) 4 MG/ML IV SOLN: 10.0000 mg | Freq: Once | INTRAVENOUS | Status: DC

## 2017-01-26 MED ORDER — DIPHENHYDRAMINE HCL 25 MG PO CAPS
ORAL_CAPSULE | ORAL | Status: AC
  Administered 2017-01-26: 25 mg via ORAL
  Filled 2017-01-26: qty 1

## 2017-01-26 NOTE — Discharge Instructions (Signed)
Kidney Stones Kidney stones (urolithiasis) are rock-like masses that form inside of the kidneys. Kidneys are organs that make pee (urine). A kidney stone can cause very bad pain and can block the flow of pee. The stone usually leaves your body (passes) through your pee. You may need to have a doctor take out the stone. Follow these instructions at home: Eating and drinking  Drink enough fluid to keep your pee clear or pale yellow. This will help you pass the stone.  If told by your doctor, change the foods you eat (your diet). This may include: ? Limiting how much salt (sodium) you eat. ? Eating more fruits and vegetables. ? Limiting how much meat, poultry, fish, and eggs you eat.  Follow instructions from your doctor about eating or drinking restrictions. General instructions  Collect pee samples as told by your doctor. You may need to collect a pee sample: ? 24 hours after a stone comes out. ? 8-12 weeks after a stone comes out, and every 6-12 months after that.  Strain your pee every time you pee (urinate), for as long as told. Use the strainer that your doctor recommends.  Do not throw out the stone. Keep it so that it can be tested by your doctor.  Take over-the-counter and prescription medicines only as told by your doctor.  Keep all follow-up visits as told by your doctor. This is important. You may need follow-up tests. Preventing kidney stones To prevent another kidney stone:  Drink enough fluid to keep your pee clear or pale yellow. This is the best way to prevent kidney stones.  Eat healthy foods.  Avoid certain foods as told by your doctor. You may be told to eat less protein.  Stay at a healthy weight.  Contact a doctor if:  You have pain that gets worse or does not get better with medicine. Get help right away if:  You have a fever or chills.  You get very bad pain.  You get new pain in your belly (abdomen).  You pass out (faint).  You cannot pee. This  information is not intended to replace advice given to you by your health care provider. Make sure you discuss any questions you have with your health care provider. Document Released: 12/21/2007 Document Revised: 03/22/2016 Document Reviewed: 03/22/2016 Elsevier Interactive Patient Education  2017 [employer] This sheet gives you information about how to care for yourself after your procedure. Your health care provider may also give you more specific instructions. If you have problems or questions, contact your health care provider. What can I expect after the procedure? After the procedure, it is common to have:  Some blood in your urine. This should only last for a few days.  Soreness in your back, sides, or upper abdomen for a few days.  Blotches or bruises on your back where the pressure wave entered the skin.  Pain, discomfort, or nausea when pieces (fragments) of the kidney stone move through the tube that carries urine from the kidney to the bladder (ureter). Stone fragments may pass soon after the procedure, but they may continue to pass for up to 4-8 weeks. ? If you have severe pain or nausea, contact your health care provider. This may be caused by a large stone that was not broken up, and this may mean that you need more treatment.  Some pain or discomfort during urination.  Some pain or discomfort in the lower abdomen or (in men)  at the base of the penis.  Follow these instructions at home: Medicines  Take over-the-counter and prescription medicines only as told by your health care provider.  If you were prescribed an antibiotic medicine, take it as told by your health care provider. Do not stop taking the antibiotic even if you start to feel better.  Do not drive for 24 hours if you were given a medicine to help you relax (sedative).  Do not drive or use heavy machinery while taking prescription pain medicine. Eating and drinking  Drink  enough water and fluids to keep your urine clear or pale yellow. This helps any remaining pieces of the stone to pass. It can also help prevent new stones from forming.  Eat plenty of fresh fruits and vegetables.  Follow instructions from your health care provider about eating and drinking restrictions. You may be instructed: ? To reduce how much salt (sodium) you eat or drink. Check ingredients and nutrition facts on packaged foods and beverages. ? To reduce how much meat you eat.  Eat the recommended amount of calcium for your age and gender. Ask your health care provider how much calcium you should have. General instructions  Get plenty of rest.  Most people can resume normal activities 1-2 days after the procedure. Ask your health care provider what activities are safe for you.  If directed, strain all urine through the strainer that was provided by your health care provider. ? Keep all fragments for your health care provider to see. Any stones that are found may be sent to a medical lab for examination. The stone may be as small as a grain of salt.  Keep all follow-up visits as told by your health care provider. This is important. Contact a health care provider if:  You have pain that is severe or does not get better with medicine.  You have nausea that is severe or does not go away.  You have blood in your urine longer than your health care provider told you to expect.  You have more blood in your urine.  You have pain during urination that does not go away.  You urinate more frequently than usual and this does not go away.  You develop a rash or any other possible signs of an allergic reaction. Get help right away if:  You have severe pain in your back, sides, or upper abdomen.  You have severe pain while urinating.  Your urine is very dark red.  You have blood in your stool (feces).  You cannot pass any urine at all.  You feel a strong urge to urinate after  emptying your bladder.  You have a fever or chills.  You develop shortness of breath, difficulty breathing, or chest pain.  You have severe nausea that leads to persistent vomiting.  You faint. Summary  After this procedure, it is common to have some pain, discomfort, or nausea when pieces (fragments) of the kidney stone move through the tube that carries urine from the kidney to the bladder (ureter). If this pain or nausea is severe, however, you should contact your health care provider.  Most people can resume normal activities 1-2 days after the procedure. Ask your health care provider what activities are safe for you.  Drink enough water and fluids to keep your urine clear or pale yellow. This helps any remaining pieces of the stone to pass, and it can help prevent new stones from forming.  If directed, strain your urine and  keep all fragments for your health care provider to see. Fragments or stones may be as small as a grain of salt.  Get help right away if you have severe pain in your back, sides, or upper abdomen or have severe pain while urinating. This information is not intended to replace advice given to you by your health care provider. Make sure you discuss any questions you have with your health care provider. Document Released: 07/24/2007 Document Revised: 05/25/2016 Document Reviewed: 05/25/2016 Elsevier Interactive Patient Education  2017 [employer]   Dietary Guidelines to Help Prevent Kidney Stones Kidney stones are deposits of minerals and salts that form inside your kidneys. Your risk of developing kidney stones may be greater depending on your diet, your lifestyle, the medicines you take, and whether you have certain medical conditions. Most people can reduce their chances of developing kidney stones by following the instructions below. Depending on your overall health and the type of kidney stones you tend to develop, your dietitian may give you more specific  instructions. What are tips for following this plan? Reading food labels  Choose foods with "no salt added" or "low-salt" labels. Limit your sodium intake to less than 1500 mg per day.  Choose foods with calcium for each meal and snack. Try to eat about 300 mg of calcium at each meal. Foods that contain 200-500 mg of calcium per serving include: ? 8 oz (237 ml) of milk, fortified nondairy milk, and fortified fruit juice. ? 8 oz (237 ml) of kefir, yogurt, and soy yogurt. ? 4 oz (118 ml) of tofu. ? 1 oz of cheese. ? 1 cup (300 g) of dried figs. ? 1 cup (91 g) of cooked broccoli. ? 1-3 oz can of sardines or mackerel.  Most people need 1000 to 1500 mg of calcium each day. Talk to your dietitian about how much calcium is recommended for you. Shopping  Buy plenty of fresh fruits and vegetables. Most people do not need to avoid fruits and vegetables, even if they contain nutrients that may contribute to kidney stones.  When shopping for convenience foods, choose: ? Whole pieces of fruit. ? Premade salads with dressing on the side. ? Low-fat fruit and yogurt smoothies.  Avoid buying frozen meals or prepared deli foods.  Look for foods with live cultures, such as yogurt and kefir. Cooking  Do not add salt to food when cooking. Place a salt shaker on the table and allow each person to add his or her own salt to taste.  Use vegetable protein, such as beans, textured vegetable protein (TVP), or tofu instead of meat in pasta, casseroles, and soups. Meal planning  Eat less salt, if told by your dietitian. To do this: ? Avoid eating processed or premade food. ? Avoid eating fast food.  Eat less animal protein, including cheese, meat, poultry, or fish, if told by your dietitian. To do this: ? Limit the number of times you have meat, poultry, fish, or cheese each week. Eat a diet free of meat at least 2 days a week. ? Eat only one serving each day of meat, poultry, fish, or seafood. ? When  you prepare animal protein, cut pieces into small portion sizes. For most meat and fish, one serving is about the size of one deck of cards.  Eat at least 5 servings of fresh fruits and vegetables each day. To do this: ? Keep fruits and vegetables on hand for snacks. ? Eat 1 piece of fruit or a  handful of berries with breakfast. ? Have a salad and fruit at lunch. ? Have two kinds of vegetables at dinner.  Limit foods that are high in a substance called oxalate. These include: ? Spinach. ? Rhubarb. ? Beets. ? Potato chips and french fries. ? Nuts.  If you regularly take a diuretic medicine, make sure to eat at least 1-2 fruits or vegetables high in potassium each day. These include: ? Avocado. ? Banana. ? Orange, prune, carrot, or tomato juice. ? Baked potato. ? Cabbage. ? Beans and split peas. General instructions  Drink enough fluid to keep your urine clear or pale yellow. This is the most important thing you can do.  Talk to your health care provider and dietitian about taking daily supplements. Depending on your health and the cause of your kidney stones, you may be advised: ? Not to take supplements with vitamin C. ? To take a calcium supplement. ? To take a daily probiotic supplement. ? To take other supplements such as magnesium, fish oil, or vitamin B6.  Take all medicines and supplements as told by your health care provider.  Limit alcohol intake to no more than 1 drink a day for nonpregnant women and 2 drinks a day for men. One drink equals 12 oz of beer, 5 oz of wine, or 1 oz of hard liquor.  Lose weight if told by your health care provider. Work with your dietitian to find strategies and an eating plan that works best for you. What foods are not recommended? Limit your intake of the following foods, or as told by your dietitian. Talk to your dietitian about specific foods you should avoid based on the type of kidney stones and your overall health. Grains Breads.  Bagels. Rolls. Baked goods. Salted crackers. Cereal. Pasta. Vegetables Spinach. Rhubarb. Beets. Canned vegetables. Rosita Fire. Olives. Meats and other protein foods Nuts. Nut butters. Large portions of meat, poultry, or fish. Salted or cured meats. Deli meats. Hot dogs. Sausages. Dairy Cheese. Beverages Regular soft drinks. Regular vegetable juice. Seasonings and other foods Seasoning blends with salt. Salad dressings. Canned soups. Soy sauce. Ketchup. Barbecue sauce. Canned pasta sauce. Casseroles. Pizza. Lasagna. Frozen meals. Potato chips. [place] fries. Summary  You can reduce your risk of kidney stones by making changes to your diet.  The most important thing you can do is drink enough fluid. You should drink enough fluid to keep your urine clear or pale yellow.  Ask your health care provider or dietitian how much protein from animal sources you should eat each day, and also how much salt and calcium you should have each day. This information is not intended to replace advice given to you by your health care provider. Make sure you discuss any questions you have with your health care provider. Document Released: 10/29/2010 Document Revised: 06/14/2016 Document Reviewed: 06/14/2016 Elsevier Interactive Patient Education  2017 [employer].

## 2017-01-27 ENCOUNTER — Encounter: Payer: Self-pay | Admitting: Urology

## 2017-02-02 DIAGNOSIS — N200 Calculus of kidney: Secondary | ICD-10-CM | POA: Diagnosis not present

## 2017-02-02 DIAGNOSIS — N201 Calculus of ureter: Secondary | ICD-10-CM | POA: Diagnosis not present

## 2017-04-15 DIAGNOSIS — Z23 Encounter for immunization: Secondary | ICD-10-CM | POA: Diagnosis not present

## 2017-07-24 DIAGNOSIS — J209 Acute bronchitis, unspecified: Secondary | ICD-10-CM | POA: Diagnosis not present

## 2017-11-09 ENCOUNTER — Other Ambulatory Visit: Payer: Self-pay

## 2017-11-09 ENCOUNTER — Encounter
Admission: EM | Disposition: A | Discharge status: Home / Self Care | Payer: Self-pay | Source: Ambulatory Visit | Attending: Urology

## 2017-11-09 ENCOUNTER — Ambulatory Visit
Admission: EM | Admit: 2017-11-09 | Discharge: 2017-11-09 | Disposition: A | Discharge status: Home / Self Care | Payer: BLUE CROSS/BLUE SHIELD | Source: Ambulatory Visit | Attending: Urology | Admitting: Urology

## 2017-11-09 DIAGNOSIS — N201 Calculus of ureter: Secondary | ICD-10-CM | POA: Insufficient documentation

## 2017-11-09 DIAGNOSIS — N23 Unspecified renal colic: Secondary | ICD-10-CM | POA: Diagnosis not present

## 2017-11-09 HISTORY — PX: EXTRACORPOREAL SHOCK WAVE LITHOTRIPSY: SHX1557

## 2017-11-09 SURGERY — LITHOTRIPSY, ESWL
Anesthesia: Moderate Sedation | Laterality: Left

## 2017-11-09 MED ORDER — LEVOFLOXACIN 500 MG PO TABS
500.0000 mg | ORAL_TABLET | Freq: Once | ORAL | Status: AC
  Administered 2017-11-09: 500 mg via ORAL

## 2017-11-09 MED ORDER — PROMETHAZINE HCL 25 MG/ML IJ SOLN
25.0000 mg | Freq: Once | INTRAMUSCULAR | Status: AC
  Administered 2017-11-09: 25 mg via INTRAMUSCULAR

## 2017-11-09 MED ORDER — DEXTROSE-NACL 5-0.45 % IV SOLN
INTRAVENOUS | Status: DC
  Administered 2017-11-09: 15:00:00 via INTRAVENOUS

## 2017-11-09 MED ORDER — TAPENTADOL HCL 50 MG PO TABS
ORAL_TABLET | ORAL | Status: AC
  Filled 2017-11-09: qty 1

## 2017-11-09 MED ORDER — MORPHINE SULFATE (PF) 10 MG/ML IV SOLN
INTRAVENOUS | Status: AC
  Administered 2017-11-09: 10 mg
  Filled 2017-11-09: qty 1

## 2017-11-09 MED ORDER — PROMETHAZINE HCL 25 MG/ML IJ SOLN
INTRAMUSCULAR | Status: AC
  Administered 2017-11-09: 25 mg via INTRAMUSCULAR
  Filled 2017-11-09: qty 1

## 2017-11-09 MED ORDER — MIDAZOLAM HCL 2 MG/2ML IJ SOLN
INTRAMUSCULAR | Status: AC
  Administered 2017-11-09: 1 mg via INTRAMUSCULAR
  Filled 2017-11-09: qty 2

## 2017-11-09 MED ORDER — FUROSEMIDE 10 MG/ML IJ SOLN
10.0000 mg | Freq: Once | INTRAMUSCULAR | Status: AC
  Administered 2017-11-09: 10 mg via INTRAVENOUS

## 2017-11-09 MED ORDER — FUROSEMIDE 10 MG/ML IJ SOLN
INTRAMUSCULAR | Status: AC
  Filled 2017-11-09: qty 2

## 2017-11-09 MED ORDER — MORPHINE SULFATE (PF) 2 MG/ML IV SOLN
10.0000 mg | Freq: Once | INTRAVENOUS | Status: DC
  Filled 2017-11-09: qty 5

## 2017-11-09 MED ORDER — LEVOFLOXACIN 500 MG PO TABS
ORAL_TABLET | ORAL | Status: AC
  Administered 2017-11-09: 500 mg via ORAL
  Filled 2017-11-09: qty 1

## 2017-11-09 MED ORDER — DIPHENHYDRAMINE HCL 25 MG PO CAPS
ORAL_CAPSULE | ORAL | Status: AC
  Administered 2017-11-09: 25 mg via ORAL
  Filled 2017-11-09: qty 1

## 2017-11-09 MED ORDER — DIPHENHYDRAMINE HCL 25 MG PO CAPS
25.0000 mg | ORAL_CAPSULE | Freq: Once | ORAL | Status: AC
  Administered 2017-11-09: 25 mg via ORAL

## 2017-11-09 MED ORDER — SODIUM CHLORIDE FLUSH 0.9 % IV SOLN
INTRAVENOUS | Status: AC
  Filled 2017-11-09: qty 10

## 2017-11-09 MED ORDER — TAPENTADOL HCL 50 MG PO TABS
50.0000 mg | ORAL_TABLET | Freq: Four times a day (QID) | ORAL | Status: DC | PRN
  Administered 2017-11-09: 50 mg via ORAL

## 2017-11-09 MED ORDER — MIDAZOLAM HCL 2 MG/2ML IJ SOLN
1.0000 mg | Freq: Once | INTRAMUSCULAR | Status: AC
  Administered 2017-11-09: 1 mg via INTRAMUSCULAR

## 2017-11-09 MED ORDER — LEVOFLOXACIN 500 MG PO TABS: 500.0000 mg | ORAL_TABLET | Freq: Every day | ORAL | 0 refills | Status: DC

## 2017-11-09 NOTE — OR Nursing (Signed)
Patient spit out "dip" at 10:20 upon arrival to hospital.  Dr Evelene CroonWolff and litho truck notified.  Case rescheduled for 4:30 pm.  Orders to medicate at 3:30.

## 2017-11-09 NOTE — Discharge Instructions (Addendum)
Lithotripsy, Care After °This sheet gives you information about how to care for yourself after your procedure. Your health care provider may also give you more specific instructions. If you have problems or questions, contact your health care provider. °What can I expect after the procedure? °After the procedure, it is common to have: °· Some blood in your urine. This should only last for a few days. °· Soreness in your back, sides, or upper abdomen for a few days. °· Blotches or bruises on your back where the pressure wave entered the skin. °· Pain, discomfort, or nausea when pieces (fragments) of the kidney stone move through the tube that carries urine from the kidney to the bladder (ureter). Stone fragments may pass soon after the procedure, but they may continue to pass for up to 4-8 weeks. °? If you have severe pain or nausea, contact your health care provider. This may be caused by a large stone that was not broken up, and this may mean that you need more treatment. °· Some pain or discomfort during urination. °· Some pain or discomfort in the lower abdomen or (in men) at the base of the penis. ° °Follow these instructions at home: °Medicines °· Take over-the-counter and prescription medicines only as told by your health care provider. °· If you were prescribed an antibiotic medicine, take it as told by your health care provider. Do not stop taking the antibiotic even if you start to feel better. °· Do not drive for 24 hours if you were given a medicine to help you relax (sedative). °· Do not drive or use heavy machinery while taking prescription pain medicine. °Eating and drinking °· Drink enough water and fluids to keep your urine clear or pale yellow. This helps any remaining pieces of the stone to pass. It can also help prevent new stones from forming. °· Eat plenty of fresh fruits and vegetables. °· Follow instructions from your health care provider about eating and drinking restrictions. You may be  instructed: °? To reduce how much salt (sodium) you eat or drink. Check ingredients and nutrition facts on packaged foods and beverages. °? To reduce how much meat you eat. °· Eat the recommended amount of calcium for your age and gender. Ask your health care provider how much calcium you should have. °General instructions °· Get plenty of rest. °· Most people can resume normal activities 1-2 days after the procedure. Ask your health care provider what activities are safe for you. °· If directed, strain all urine through the strainer that was provided by your health care provider. °? Keep all fragments for your health care provider to see. Any stones that are found may be sent to a medical lab for examination. The stone may be as small as a grain of salt. °· Keep all follow-up visits as told by your health care provider. This is important. °Contact a health care provider if: °· You have pain that is severe or does not get better with medicine. °· You have nausea that is severe or does not go away. °· You have blood in your urine longer than your health care provider told you to expect. °· You have more blood in your urine. °· You have pain during urination that does not go away. °· You urinate more frequently than usual and this does not go away. °· You develop a rash or any other possible signs of an allergic reaction. °Get help right away if: °· You have severe pain in   your back, sides, or upper abdomen. °· You have severe pain while urinating. °· Your urine is very dark red. °· You have blood in your stool (feces). °· You cannot pass any urine at all. °· You feel a strong urge to urinate after emptying your bladder. °· You have a fever or chills. °· You develop shortness of breath, difficulty breathing, or chest pain. °· You have severe nausea that leads to persistent vomiting. °· You faint. °Summary °· After this procedure, it is common to have some pain, discomfort, or nausea when pieces (fragments) of the  kidney stone move through the tube that carries urine from the kidney to the bladder (ureter). If this pain or nausea is severe, however, you should contact your health care provider. °· Most people can resume normal activities 1-2 days after the procedure. Ask your health care provider what activities are safe for you. °· Drink enough water and fluids to keep your urine clear or pale yellow. This helps any remaining pieces of the stone to pass, and it can help prevent new stones from forming. °· If directed, strain your urine and keep all fragments for your health care provider to see. Fragments or stones may be as small as a grain of salt. °· Get help right away if you have severe pain in your back, sides, or upper abdomen or have severe pain while urinating. °This information is not intended to replace advice given to you by your health care provider. Make sure you discuss any questions you have with your health care provider. ° °AMBULATORY SURGERY  °DISCHARGE INSTRUCTIONS ° ° °1) The drugs that you were given will stay in your system until tomorrow so for the next 24 hours you should not: ° °A) Drive an automobile °B) Make any legal decisions °C) Drink any alcoholic beverage ° ° °2) You may resume regular meals tomorrow.  Today it is better to start with liquids and gradually work up to solid foods. ° °You may eat anything you prefer, but it is better to start with liquids, then soup and crackers, and gradually work up to solid foods. ° ° °3) Please notify your doctor immediately if you have any unusual bleeding, trouble breathing, redness and pain at the surgery site, drainage, fever, or pain not relieved by medication. ° ° ° °4) Additional Instructions: ° ° °Please contact your physician with any problems or Same Day Surgery at [phone], Monday through Friday 6 am to 4 pm, or [facility] [place] Main number at [phone]. °Document Released: 07/24/2007 Document Revised: 05/25/2016 Document Reviewed:  05/25/2016 °Elsevier Interactive Patient Education © 2018 [employer] ° °

## 2017-11-10 ENCOUNTER — Encounter: Payer: Self-pay | Admitting: Urology

## 2017-11-23 DIAGNOSIS — N201 Calculus of ureter: Secondary | ICD-10-CM | POA: Diagnosis not present

## 2017-12-05 DIAGNOSIS — I8312 Varicose veins of left lower extremity with inflammation: Secondary | ICD-10-CM | POA: Diagnosis not present

## 2017-12-05 DIAGNOSIS — I83813 Varicose veins of bilateral lower extremities with pain: Secondary | ICD-10-CM | POA: Diagnosis not present

## 2017-12-05 DIAGNOSIS — I8311 Varicose veins of right lower extremity with inflammation: Secondary | ICD-10-CM | POA: Diagnosis not present

## 2017-12-13 DIAGNOSIS — I8311 Varicose veins of right lower extremity with inflammation: Secondary | ICD-10-CM | POA: Diagnosis not present

## 2017-12-13 DIAGNOSIS — I8312 Varicose veins of left lower extremity with inflammation: Secondary | ICD-10-CM | POA: Diagnosis not present

## 2017-12-18 DIAGNOSIS — I83813 Varicose veins of bilateral lower extremities with pain: Secondary | ICD-10-CM | POA: Diagnosis not present

## 2017-12-18 DIAGNOSIS — I8312 Varicose veins of left lower extremity with inflammation: Secondary | ICD-10-CM | POA: Diagnosis not present

## 2017-12-18 DIAGNOSIS — I8311 Varicose veins of right lower extremity with inflammation: Secondary | ICD-10-CM | POA: Diagnosis not present

## 2018-02-12 ENCOUNTER — Ambulatory Visit (INDEPENDENT_AMBULATORY_CARE_PROVIDER_SITE_OTHER): Payer: BLUE CROSS/BLUE SHIELD | Admitting: Internal Medicine

## 2018-02-12 ENCOUNTER — Encounter: Payer: Self-pay | Admitting: Internal Medicine

## 2018-02-12 VITALS — BP 128/82 | HR 90 | Ht 69.0 in | Wt 261.0 lb

## 2018-02-12 DIAGNOSIS — R079 Chest pain, unspecified: Secondary | ICD-10-CM | POA: Diagnosis not present

## 2018-02-12 HISTORY — DX: Chest pain, unspecified: R07.9

## 2018-02-12 NOTE — Patient Instructions (Signed)
Aspirin 81 mg once a day

## 2018-02-12 NOTE — Progress Notes (Signed)
Date:  02/12/2018   Name:  John Bauer   DOB:  04/04/1971   MRN:  [id number]   Chief Complaint: Establish Care and Chest Pain (Off and on. Lasted 1.5 weeks with a low ache. Then went away and came back again. With family hx wants checked out. Tired no enery. When chest was hurting last two fingers in left hand would tingle like going numb. Last wee was last episode. No SOB.) Chest Pain   This is a recurrent problem. The current episode started 1 to 4 weeks ago. The onset quality is undetermined. The problem has been unchanged. The pain is present in the substernal region. The pain is mild. The pain radiates to the left arm and right arm. Associated symptoms include headaches and malaise/fatigue. Pertinent negatives include no abdominal pain, back pain, cough, diaphoresis, dizziness, fever, irregular heartbeat, near-syncope, palpitations or shortness of breath. The pain is aggravated by nothing. He has tried nothing for the symptoms. Risk factors include oral contraceptive use.    Review of Systems  Constitutional: Positive for malaise/fatigue. Negative for chills, diaphoresis, fever and unexpected weight change.  Respiratory: Negative for cough, chest tightness, shortness of breath and wheezing.   Cardiovascular: Positive for chest pain. Negative for palpitations and near-syncope.  Gastrointestinal: Negative for abdominal pain.  Genitourinary: Negative for dysuria.  Musculoskeletal: Negative for arthralgias and back pain.  Neurological: Positive for headaches. Negative for dizziness, tremors and light-headedness.  Psychiatric/Behavioral: The patient is not nervous/anxious.     There are no active problems to display for this patient.   Prior to Admission medications   Medication Sig Start Date End Date Taking? Authorizing Provider  cetirizine (ZYRTEC) 10 MG tablet Take 10 mg by mouth daily as needed for allergies.   Yes [provider]    No Known Allergies  Past  Surgical History:  Procedure Laterality Date  . CHOLECYSTECTOMY  age 88's  . EXTRACORPOREAL SHOCK WAVE LITHOTRIPSY Right 01/26/2017   Procedure: EXTRACORPOREAL SHOCK WAVE LITHOTRIPSY (ESWL);  Surgeon: Orson Ape, MD;  Location: [facility];  Service: Urology;  Laterality: Right;  . EXTRACORPOREAL SHOCK WAVE LITHOTRIPSY Left 11/09/2017   Procedure: EXTRACORPOREAL SHOCK WAVE LITHOTRIPSY (ESWL);  Surgeon: Orson Ape, MD;  Location: [facility];  Service: Urology;  Laterality: Left;  . FOOT SURGERY Right 1995  . HOLMIUM LASER APPLICATION Right 01/03/2017   Procedure: HOLMIUM LASER APPLICATION;  Surgeon: Orson Ape, MD;  Location: [facility];  Service: Urology;  Laterality: Right;  . URETERAL STENT PLACEMENT  12/2016  . URETEROLITHOTOMY  2008  . URETEROSCOPY WITH HOLMIUM LASER LITHOTRIPSY Right 01/03/2017   Procedure: URETEROSCOPY WITH HOLMIUM LASER LITHOTRIPSY;  Surgeon: Orson Ape, MD;  Location: [facility];  Service: Urology;  Laterality: Right;    Social History   Tobacco Use  . Smoking status: Former Smoker    Packs/day: 1.00    Years: 5.00    Pack years: 5.00    Types: Cigarettes    Last attempt to quit: 12/20/1993    Years since quitting: 24.1  . Smokeless tobacco: Former Neurosurgeon    Types: Snuff    Quit date: 12/21/2011  Substance Use Topics  . Alcohol use: Yes    Alcohol/week: 2.4 oz    Types: 4 Standard drinks or equivalent per week  . Drug use: Not Currently     Medication list has been reviewed and updated.  Current Meds  Medication Sig  . cetirizine (ZYRTEC) 10 MG tablet Take 10 mg  by mouth daily as needed for allergies.    PHQ 2/9 Scores 02/12/2018  PHQ - 2 Score 0    Physical Exam  Constitutional: He is oriented to person, place, and time. He appears well-developed. No distress.  HENT:  Head: Normocephalic and atraumatic.  Neck: Normal range of motion. Neck supple. Carotid bruit is not present. No thyroid mass present.  Cardiovascular: Normal rate,  regular rhythm and normal pulses.  No murmur heard. Pulmonary/Chest: Effort normal. No respiratory distress. He has no decreased breath sounds. He has no wheezes. He has no rales.  Musculoskeletal: Normal range of motion.       Right lower leg: Normal.       Left lower leg: Normal.  Lymphadenopathy:    He has no cervical adenopathy.  Neurological: He is alert and oriented to person, place, and time.  Skin: Skin is warm and dry. No rash noted.  Psychiatric: He has a normal mood and affect. His behavior is normal. Thought content normal.  Nursing note and vitals reviewed.   BP 128/82   Pulse 90   Ht 5\' 9"  (1.753 m)   Wt 261 lb (118.4 kg)   SpO2 96%   BMI 38.54 kg/m   Assessment and Plan: 1. Chest pain, unspecified type Begin ASA 81 mg per day Go to ED if chest pain persistent - EKG 12-Lead - NSR @ 83, WNL - CBC with Differential/Platelet - Comprehensive metabolic panel - Lipid panel - Ambulatory referral to Cardiology   No orders of the defined types were placed in this encounter.   Partially dictated using Animal nutritionistDragon software. Any errors are unintentional.  Bari EdwardLaura Raelene Trew, MD [facility]  02/12/2018

## 2018-02-13 ENCOUNTER — Encounter: Payer: Self-pay | Admitting: Cardiovascular Disease

## 2018-02-13 ENCOUNTER — Ambulatory Visit (INDEPENDENT_AMBULATORY_CARE_PROVIDER_SITE_OTHER): Payer: BLUE CROSS/BLUE SHIELD | Admitting: Cardiovascular Disease

## 2018-02-13 VITALS — BP 142/98 | HR 73 | Ht 69.0 in | Wt 259.0 lb

## 2018-02-13 DIAGNOSIS — N200 Calculus of kidney: Secondary | ICD-10-CM | POA: Insufficient documentation

## 2018-02-13 DIAGNOSIS — Z87891 Personal history of nicotine dependence: Secondary | ICD-10-CM | POA: Diagnosis not present

## 2018-02-13 DIAGNOSIS — R5382 Chronic fatigue, unspecified: Secondary | ICD-10-CM | POA: Diagnosis not present

## 2018-02-13 DIAGNOSIS — R079 Chest pain, unspecified: Secondary | ICD-10-CM | POA: Diagnosis not present

## 2018-02-13 LAB — COMPREHENSIVE METABOLIC PANEL
ALBUMIN: 4.6 g/dL (ref 3.5–5.5)
ALK PHOS: 49 IU/L (ref 39–117)
ALT: 35 IU/L (ref 0–44)
AST: 23 IU/L (ref 0–40)
Albumin/Globulin Ratio: 1.9 (ref 1.2–2.2)
BILIRUBIN TOTAL: 1.4 mg/dL — AB (ref 0.0–1.2)
BUN / CREAT RATIO: 18 (ref 9–20)
BUN: 19 mg/dL (ref 6–24)
CHLORIDE: 101 mmol/L (ref 96–106)
CO2: 22 mmol/L (ref 20–29)
Calcium: 9.3 mg/dL (ref 8.7–10.2)
Creatinine, Ser: 1.07 mg/dL (ref 0.76–1.27)
GFR calc non Af Amer: 82 mL/min/{1.73_m2} (ref >59)
GFR, EST AFRICAN AMERICAN: 95 mL/min/{1.73_m2} (ref >59)
GLUCOSE: 90 mg/dL (ref 65–99)
Globulin, Total: 2.4 g/dL (ref 1.5–4.5)
Potassium: 4.4 mmol/L (ref 3.5–5.2)
Sodium: 140 mmol/L (ref 134–144)
TOTAL PROTEIN: 7 g/dL (ref 6.0–8.5)

## 2018-02-13 LAB — CBC WITH DIFFERENTIAL/PLATELET
BASOS: 1 %
Basophils Absolute: 0.1 10*3/uL (ref 0.0–0.2)
EOS (ABSOLUTE): 0.3 10*3/uL (ref 0.0–0.4)
Eos: 4 %
HEMOGLOBIN: 16.3 g/dL (ref 13.0–17.7)
Hematocrit: 47.5 % (ref 37.5–51.0)
Immature Grans (Abs): 0 10*3/uL (ref 0.0–0.1)
Immature Granulocytes: 0 %
LYMPHS ABS: 2 10*3/uL (ref 0.7–3.1)
Lymphs: 30 %
MCH: 32.3 pg (ref 26.6–33.0)
MCHC: 34.3 g/dL (ref 31.5–35.7)
MCV: 94 fL (ref 79–97)
Monocytes Absolute: 0.6 10*3/uL (ref 0.1–0.9)
Monocytes: 8 %
NEUTROS ABS: 3.9 10*3/uL (ref 1.4–7.0)
Neutrophils: 57 %
PLATELETS: 336 10*3/uL (ref 150–450)
RBC: 5.04 x10E6/uL (ref 4.14–5.80)
RDW: 12.8 % (ref 12.3–15.4)
WBC: 6.8 10*3/uL (ref 3.4–10.8)

## 2018-02-13 LAB — LIPID PANEL
CHOLESTEROL TOTAL: 136 mg/dL (ref 100–199)
Chol/HDL Ratio: 2.5 ratio (ref 0.0–5.0)
HDL: 55 mg/dL (ref >39)
LDL CALC: 66 mg/dL (ref 0–99)
Triglycerides: 75 mg/dL (ref 0–149)
VLDL CHOLESTEROL CAL: 15 mg/dL (ref 5–40)

## 2018-02-13 NOTE — Patient Instructions (Signed)

## 2018-02-13 NOTE — Progress Notes (Signed)
Cardiology Office Note  Date:  02/13/2018   ID:  [name], DOB [birth year], MRN [id number]  PCP:  John Milan, MD   Chief Complaint  Patient presents with  . other    Ref by Dr. Judithann Bauer for chest pain. Meds reviewed by the pt. verbally. Pt. c/o feeling tired and no energy with chest pain for 3-4 weeks with fingers tingling in left.     HPI:  [name] is a 47 year-old gentleman with past medical history of Smoking, quit age 6 Kidney stones Who presents by referral from Dr. 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 She was long-time smoker Father died from suicide in his early 57s  EKG personally reviewed by myself on todays visit Shows normal sinus rhythm with rate 73 bpm no significant ST or T-wave changes   PMH:   has a past medical history of History of kidney stones and Right ureteral stone.  PSH:    Past Surgical History:  Procedure Laterality Date  . CHOLECYSTECTOMY  age 35's  .  EXTRACORPOREAL SHOCK WAVE LITHOTRIPSY Right 01/26/2017   Procedure: EXTRACORPOREAL SHOCK WAVE LITHOTRIPSY (ESWL);  Surgeon: Orson Ape, MD;  Location: [facility];  Service: Urology;  Laterality: Right;  . EXTRACORPOREAL SHOCK WAVE LITHOTRIPSY Left 11/09/2017   Procedure: EXTRACORPOREAL SHOCK WAVE LITHOTRIPSY (ESWL);  Surgeon: Orson Ape, MD;  Location: [facility];  Service: Urology;  Laterality: Left;  . FOOT SURGERY Right 1995  . HOLMIUM LASER APPLICATION Right 01/03/2017   Procedure: HOLMIUM LASER APPLICATION;  Surgeon: Orson Ape, MD;  Location: [facility];  Service: Urology;  Laterality: Right;  . URETERAL STENT PLACEMENT  12/2016  . URETEROLITHOTOMY  2008  . URETEROSCOPY WITH HOLMIUM LASER LITHOTRIPSY Right 01/03/2017   Procedure: URETEROSCOPY WITH HOLMIUM LASER LITHOTRIPSY;  Surgeon: Orson Ape, MD;  Location: [facility];  Service: Urology;  Laterality: Right;    Current Outpatient Medications  Medication Sig Dispense Refill  . cetirizine (ZYRTEC) 10 MG tablet Take 10 mg by mouth daily as needed for allergies.     No current facility-administered medications for this visit.      Allergies:   Patient has no known allergies.   Social History:  The patient  reports that he quit smoking about 24 years ago. His smoking use included cigarettes. He has a 5.00 pack-year smoking history. He has quit using smokeless tobacco. His smokeless tobacco use included snuff. He reports that he drinks about 2.4 oz of alcohol per week. He reports that he has current or past drug history.  Family History:   family history includes Depression in his father; Heart disease in his mother; Hypertension in his mother; Peripheral Artery Disease (age of onset: 4163) in his mother; Suicidality (age of onset: 3453) in his father.    Review of Systems: Review of Systems  Constitutional: Negative.   Respiratory: Negative.   Cardiovascular: Negative.        Dull  left side chest pain now resolved   Gastrointestinal: Negative.   Musculoskeletal: Negative.   Neurological: Negative.   Psychiatric/Behavioral: Negative.   All other systems reviewed and are negative.    PHYSICAL EXAM: VS:  BP (!) 142/98 (BP Location: Right Arm, Patient Position: Sitting, Cuff Size: Large)   Pulse 73   Ht 5\' 9"  (1.753 m)   Wt 259 lb (117.5 kg)   BMI 38.25 kg/m  , BMI Body mass index is 38.25 kg/m. GEN: Well nourished, well developed, in no acute distress  HEENT: normal  Neck: no JVD, carotid bruits, or masses Cardiac: RRR; no murmurs, rubs, or gallops,no edema  Respiratory:  clear to auscultation bilaterally, normal work of breathing GI: soft, nontender, nondistended, + BS MS: no deformity or atrophy  Skin: warm and dry, no rash Neuro:  Strength and sensation are intact Psych: euthymic mood, full affect    Recent Labs: 02/12/2018: ALT 35; BUN 19; Creatinine, Ser 1.07; Hemoglobin 16.3; Platelets 336; Potassium 4.4; Sodium 140    Lipid Panel Lab Results  Component Value Date   CHOL 136 02/12/2018   HDL 55 02/12/2018   LDLCALC 66 02/12/2018   TRIG 75 02/12/2018      Wt Readings from Last 3 Encounters:  02/13/18 259 lb (117.5 kg)  02/12/18 261 lb (118.4 kg)  11/09/17 255 lb (115.7 kg)       ASSESSMENT AND PLAN:  Chest pain, unspecified type - Plan: EKG 12-Lead Low risk of coronary disease Cholesterol excellent very few years of smoking with no diabetes CT scan renal study with no coronary calcification distal vessels no aortic atherosclerosis noted extending down to his femoral vessels We did offer CT coronary calcium scoring As he feels well with no complaints on exertion, no further workup at this time EKG normal, clinical exam normal Recommended he call us if he has recurrent symptoms  testing could be performed if needed  History of smoking Reports that he stopped when he was 22  Kidney stones Prior history of kidney stones with lithotripsy and extraction  Chronic  fatigue Poor sleep, difficulty getting to sleep for 2 hours every night Some work stressors Suggested he try walking program in the evenings Melatonin Benadryl if needed  Disposition:   F/U as needed   Total encounter time more than 60 minutes  Greater than 50% was spent in counseling and coordination of care with the patient  Patient was seen in consultation for John EdwardLaura Berglund will be referred back to her office for ongoing care of issues detailed above      Orders Placed This Encounter  Procedures  . EKG 12-Lead     Signed, Dossie Arbourim Valor [place], M.D., Ph.D. 02/13/2018  [facility] [place] [phone]

## 2018-04-09 DIAGNOSIS — R05 Cough: Secondary | ICD-10-CM | POA: Diagnosis not present

## 2018-04-09 DIAGNOSIS — J029 Acute pharyngitis, unspecified: Secondary | ICD-10-CM | POA: Diagnosis not present

## 2018-04-09 DIAGNOSIS — J209 Acute bronchitis, unspecified: Secondary | ICD-10-CM | POA: Diagnosis not present

## 2018-04-09 DIAGNOSIS — J019 Acute sinusitis, unspecified: Secondary | ICD-10-CM | POA: Diagnosis not present

## 2018-05-02 ENCOUNTER — Other Ambulatory Visit: Payer: Self-pay

## 2018-05-02 ENCOUNTER — Ambulatory Visit
Admission: EM | Admit: 2018-05-02 | Discharge: 2018-05-02 | Disposition: A | Discharge status: Home / Self Care | Payer: BLUE CROSS/BLUE SHIELD | Attending: Family Medicine | Admitting: Family Medicine

## 2018-05-02 DIAGNOSIS — F17220 Nicotine dependence, chewing tobacco, uncomplicated: Secondary | ICD-10-CM | POA: Diagnosis not present

## 2018-05-02 DIAGNOSIS — J0100 Acute maxillary sinusitis, unspecified: Secondary | ICD-10-CM

## 2018-05-02 MED ORDER — AMOXICILLIN-POT CLAVULANATE 875-125 MG PO TABS: 1.0000 | ORAL_TABLET | Freq: Two times a day (BID) | ORAL | 0 refills | Status: DC

## 2018-05-02 NOTE — ED Provider Notes (Signed)
[facility]    CSN: [id number] Arrival date & time: 05/02/18  0810     History   Chief Complaint Chief Complaint  Patient presents with  . Sinusitis    HPI John Bauer is a 47 y.o. male.   The history is provided by the patient.  Sinusitis  Associated symptoms: congestion and cough   URI  Presenting symptoms: congestion, cough and facial pain   Severity:  Moderate Onset quality:  Sudden Duration:  1 month Timing:  Constant Progression:  Partially resolved Chronicity:  New Relieved by:  Prescription medications Ineffective treatments:  Prescription medications (minocycline) Associated symptoms: sinus pain   Risk factors: recent illness (uri) and sick contacts   Risk factors: not elderly, no chronic cardiac disease, no chronic kidney disease, no chronic respiratory disease, no diabetes mellitus, no immunosuppression and no recent travel     Past Medical History:  Diagnosis Date  . History of kidney stones   . Right ureteral stone     Patient Active Problem List   Diagnosis Date Noted  . History of smoking 02/13/2018  . Kidney stones 02/13/2018  . Chronic fatigue 02/13/2018  . Chest pain 02/12/2018    Past Surgical History:  Procedure Laterality Date  . CHOLECYSTECTOMY  age 63's  . EXTRACORPOREAL SHOCK WAVE LITHOTRIPSY Right 01/26/2017   Procedure: EXTRACORPOREAL SHOCK WAVE LITHOTRIPSY (ESWL);  Surgeon: Orson Ape, MD;  Location: [facility];  Service: Urology;  Laterality: Right;  . EXTRACORPOREAL SHOCK WAVE LITHOTRIPSY Left 11/09/2017   Procedure: EXTRACORPOREAL SHOCK WAVE LITHOTRIPSY (ESWL);  Surgeon: Orson Ape, MD;  Location: [facility];  Service: Urology;  Laterality: Left;  . FOOT SURGERY Right 1995  . HOLMIUM LASER APPLICATION Right 01/03/2017   Procedure: HOLMIUM LASER APPLICATION;  Surgeon: Orson Ape, MD;  Location: [facility];  Service: Urology;  Laterality: Right;  . URETERAL STENT PLACEMENT  12/2016  .  URETEROLITHOTOMY  2008  . URETEROSCOPY WITH HOLMIUM LASER LITHOTRIPSY Right 01/03/2017   Procedure: URETEROSCOPY WITH HOLMIUM LASER LITHOTRIPSY;  Surgeon: Orson Ape, MD;  Location: [facility];  Service: Urology;  Laterality: Right;       Home Medications    Prior to Admission medications   Medication Sig Start Date End Date Taking? Authorizing Provider  cetirizine (ZYRTEC) 10 MG tablet Take 10 mg by mouth daily as needed for allergies.   Yes [provider]  amoxicillin-clavulanate (AUGMENTIN) 875-125 MG tablet Take 1 tablet by mouth 2 (two) times daily. 05/02/18   Payton Mccallum, MD    Family History Family History  Problem Relation Age of Onset  . Heart disease Mother   . Hypertension Mother   . Peripheral Artery Disease Mother 74  . Suicidality Father 90  . Depression Father     Social History Social History   Tobacco Use  . Smoking status: Former Smoker    Packs/day: 1.00    Years: 5.00    Pack years: 5.00    Types: Cigarettes    Last attempt to quit: 12/20/1993    Years since quitting: 24.3  . Smokeless tobacco: Current User    Types: Snuff, Chew  Substance Use Topics  . Alcohol use: Yes    Alcohol/week: 4.0 standard drinks    Types: 4 Standard drinks or equivalent per week    Comment: occasionally  . Drug use: Not Currently     Allergies   Patient has no known allergies.   Review of Systems Review of Systems  HENT:  Positive for congestion and sinus pain.   Respiratory: Positive for cough.      Physical Exam Triage Vital Signs ED Triage Vitals  Enc Vitals Group     BP 05/02/18 0822 (!) 135/93     Pulse Rate 05/02/18 0822 74     Resp 05/02/18 0822 18     Temp 05/02/18 0822 98.4 F (36.9 C)     Temp Source 05/02/18 0822 Oral     SpO2 05/02/18 0822 99 %     Weight 05/02/18 0819 250 lb (113.4 kg)     Height 05/02/18 0819 5\' 9"  (1.753 m)     Head Circumference --      Peak Flow --      Pain Score 05/02/18 0819 5     Pain Loc --       Pain Edu? --      Excl. in GC? --    No data found.  Updated Vital Signs BP (!) 135/93 (BP Location: Left Arm)   Pulse 74   Temp 98.4 F (36.9 C) (Oral)   Resp 18   Ht 5\' 9"  (1.753 m)   Wt 113.4 kg   SpO2 99%   BMI 36.92 kg/m   Visual Acuity Right Eye Distance:   Left Eye Distance:   Bilateral Distance:    Right Eye Near:   Left Eye Near:    Bilateral Near:     Physical Exam  Constitutional: He appears well-developed and well-nourished. No distress.  HENT:  Head: Normocephalic and atraumatic.  Right Ear: Tympanic membrane, external ear and ear canal normal.  Left Ear: Tympanic membrane, external ear and ear canal normal.  Nose: Mucosal edema and rhinorrhea present. Right sinus exhibits maxillary sinus tenderness and frontal sinus tenderness. Left sinus exhibits maxillary sinus tenderness and frontal sinus tenderness.  Mouth/Throat: Uvula is midline, oropharynx is clear and moist and mucous membranes are normal. No oropharyngeal exudate or tonsillar abscesses.  Neck: Normal range of motion. Neck supple. No tracheal deviation present. No thyromegaly present.  Cardiovascular: Normal rate, regular rhythm and normal heart sounds.  Pulmonary/Chest: Effort normal and breath sounds normal. No stridor. No respiratory distress. He has no wheezes. He has no rales. He exhibits no tenderness.  Lymphadenopathy:    He has no cervical adenopathy.  Neurological: He is alert.  Skin: He is not diaphoretic.  Nursing note and vitals reviewed.    UC Treatments / Results  Labs (all labs ordered are listed, but only abnormal results are displayed) Labs Reviewed - No data to display  EKG None  Radiology No results found.  Procedures Procedures (including critical care time)  Medications Ordered in UC Medications - No data to display  Initial Impression / Assessment and Plan / UC Course  I have reviewed the triage vital signs and the nursing notes.  Pertinent labs & imaging  results that were available during my care of the patient were reviewed by me and considered in my medical decision making (see chart for details).      Final Clinical Impressions(s) / UC Diagnoses   Final diagnoses:  Acute maxillary sinusitis, recurrence not specified   Discharge Instructions   None    ED Prescriptions    Medication Sig Dispense Auth. Provider   amoxicillin-clavulanate (AUGMENTIN) 875-125 MG tablet Take 1 tablet by mouth 2 (two) times daily. 20 tablet Payton Mccallum, MD     1. diagnosis reviewed with patient 2. rx as per orders above; reviewed possible side effects, interactions,  risks and benefits  3. Recommend supportive treatment with otc flonase 4. Follow-up prn if symptoms worsen or don't improve  Controlled Substance Prescriptions [place] Controlled Substance Registry consulted? Not Applicable   Payton Mccallum, MD 05/02/18 [phone]

## 2018-05-02 NOTE — ED Triage Notes (Signed)
Patient complains of sinus pain and pressure with headache. Patient reports that he started having symptoms around 1 month ago, saw urgent care in [place] and was given antibiotics. Patient states that he had felt better but symptoms returned around 1 week ago and have been worsening again.

## 2018-06-06 IMAGING — CT CT RENAL STONE PROTOCOL
2 of 4 series · 16 of 46 positions shown, 18 images · non-contrast
Comparison: 12/13/2013

CLINICAL DATA: Worsening right flank pain for 1 day.
Nephrolithiasis.

EXAM:
CT ABDOMEN AND PELVIS WITHOUT CONTRAST
TECHNIQUE: Multidetector CT imaging of the abdomen and pelvis was performed
following the standard protocol without IV contrast.

[Series 2: stone full standard · axial · 0.87mm/px · z∈[-544,-64]mm · 13 of 106 slices shown, 15 images]
[im 5/106  soft-tissue]
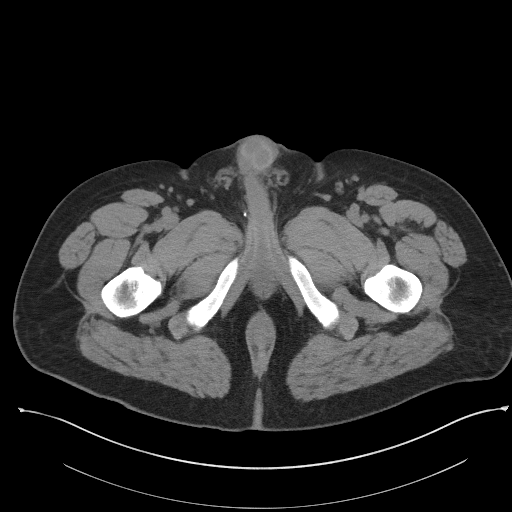
[im 5/106  bone]
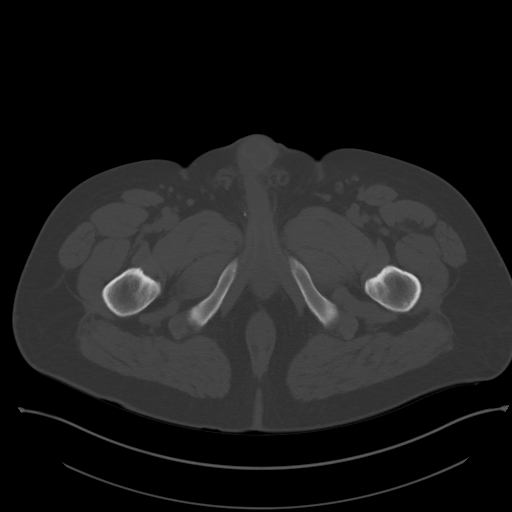
[im 14/106  soft-tissue]
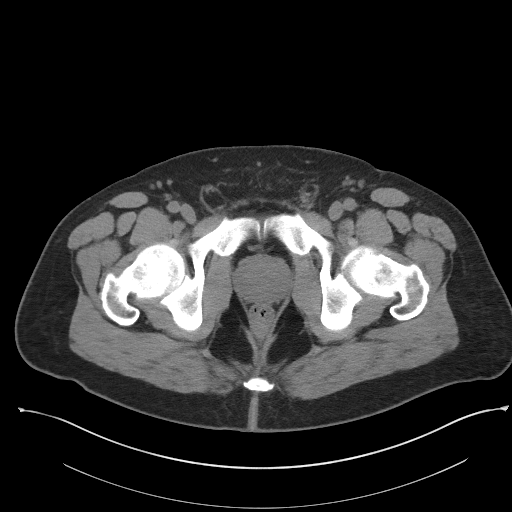
[im 22/106  soft-tissue]
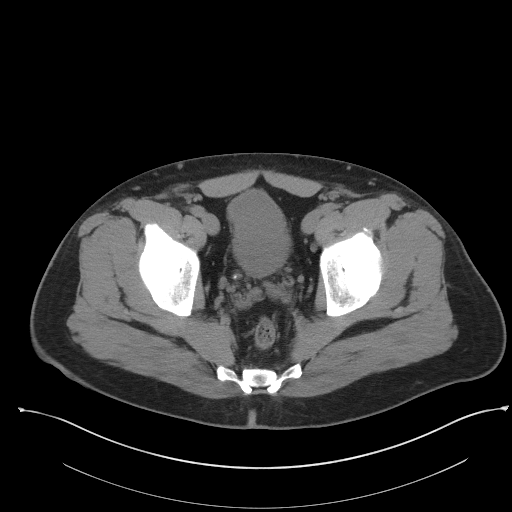
[im 31/106  soft-tissue]
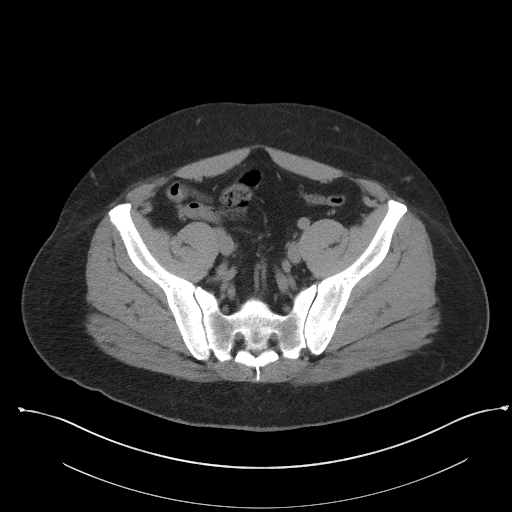
[im 36/106  soft-tissue]
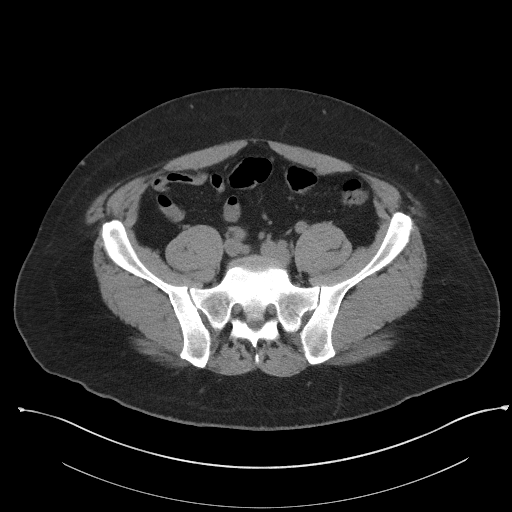
[im 44/106  soft-tissue]
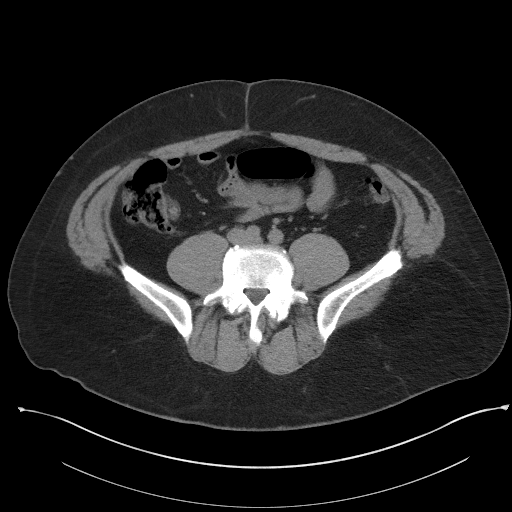
[im 53/106  soft-tissue]
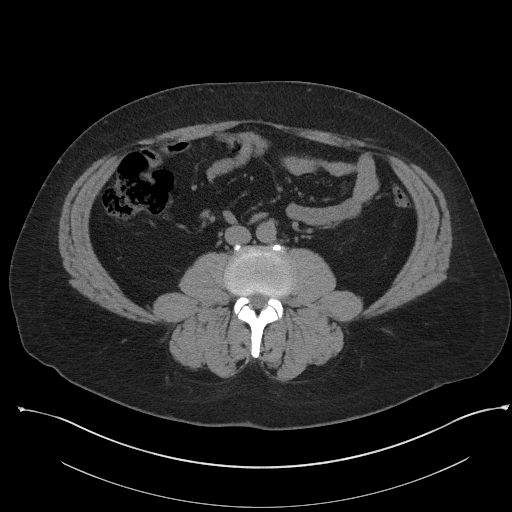
[im 62/106  soft-tissue]
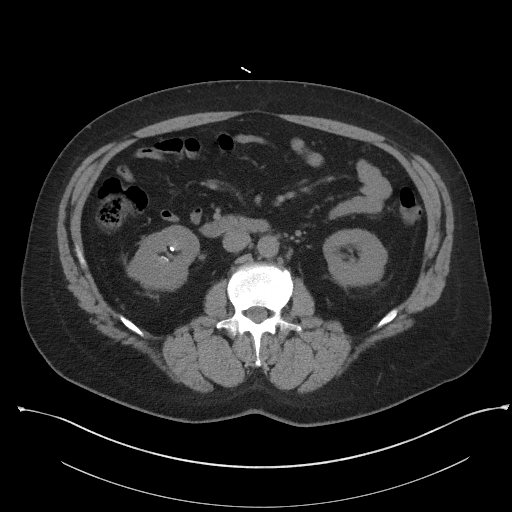
[im 71/106  soft-tissue]
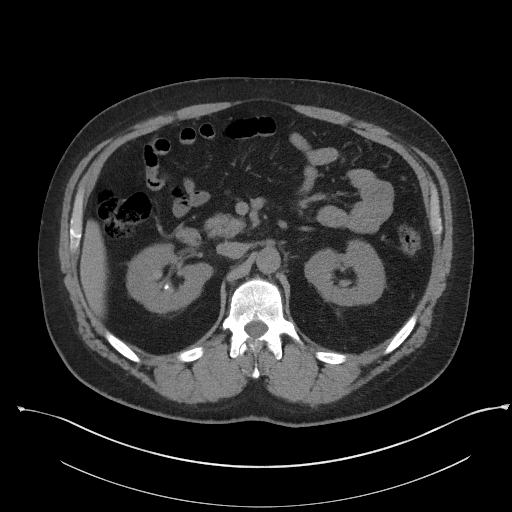
[im 71/106  bone]
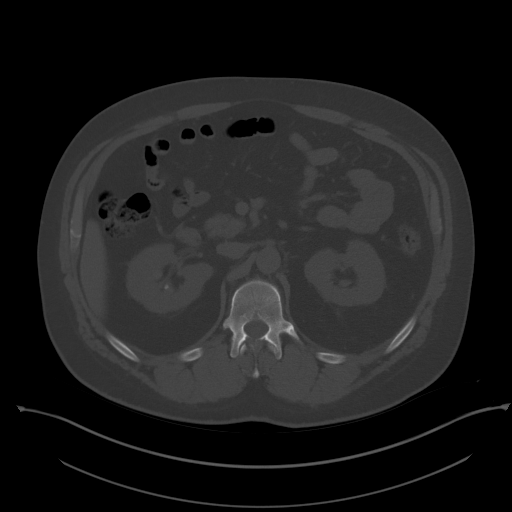
[im 75/106  soft-tissue]
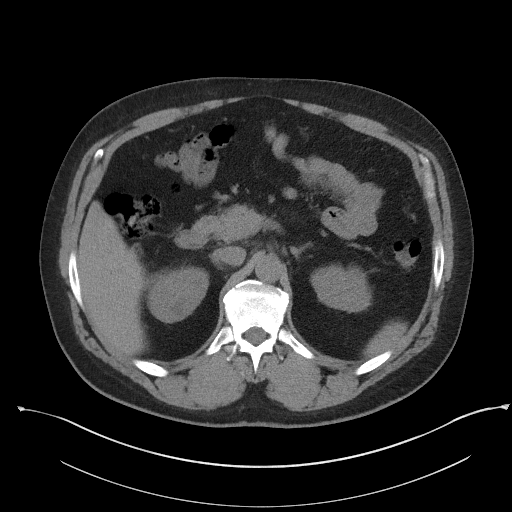
[im 84/106  soft-tissue]
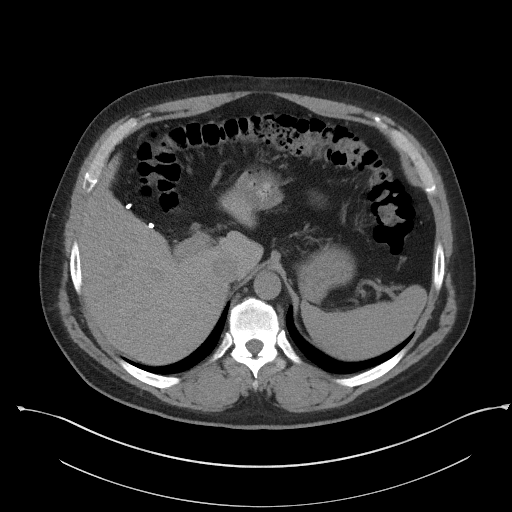
[im 92/106  soft-tissue]
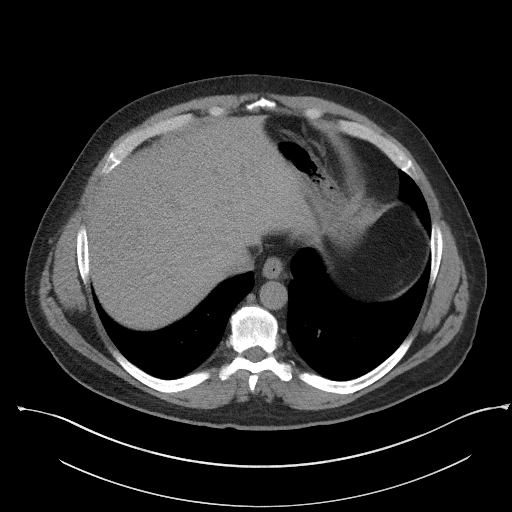
[im 101/106  soft-tissue]
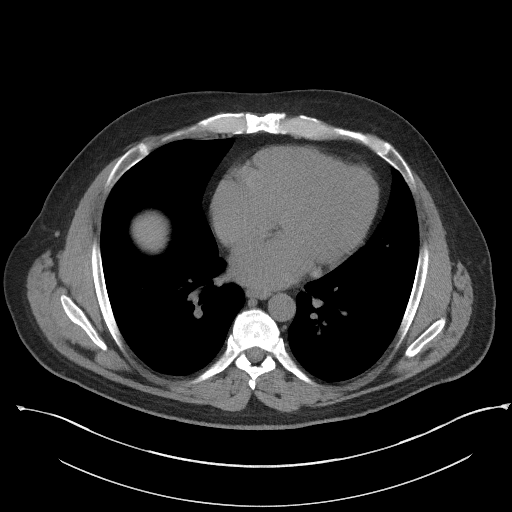

[Series 5: coronal · coronal · 0.86mm/px · 3 of 146 slices shown]
[im 49/146  soft-tissue]
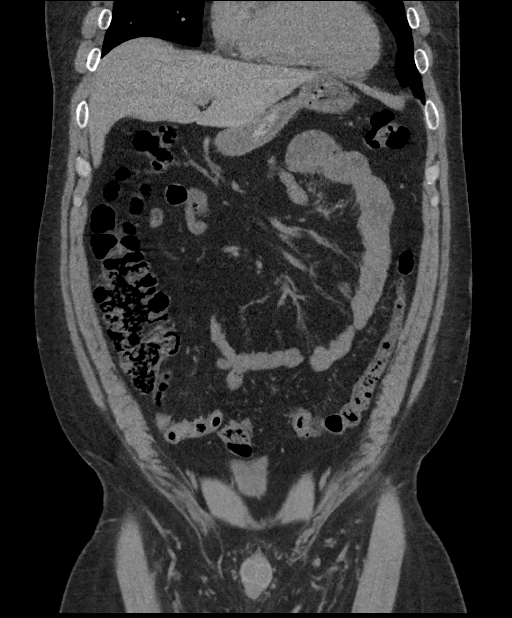
[im 65/146  soft-tissue]
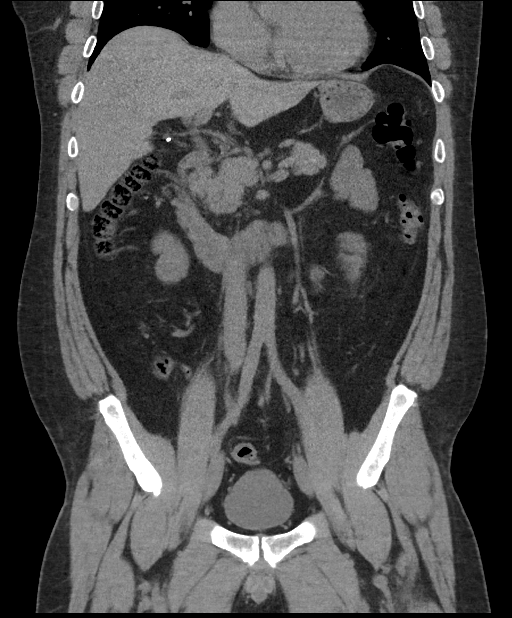
[im 81/146  soft-tissue]
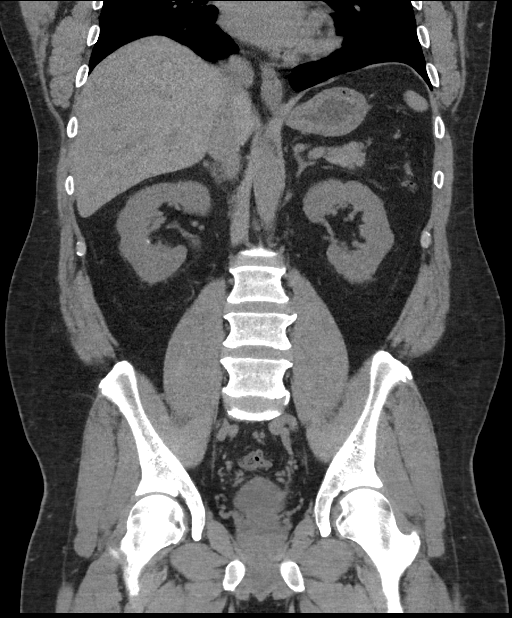

[16 of 46 positions shown; findings below may reference images not displayed]

FINDINGS: Lower chest: No acute findings.

Hepatobiliary: No masses visualized on this unenhanced exam. Prior
cholecystectomy noted. No evidence of biliary dilatation.

Pancreas: No mass or inflammatory process visualized on this
unenhanced exam.

Spleen:  Within normal limits in size.

Adrenals/Urinary tract: Multiple small less than 5 mm calculi are
seen in both kidneys. A 5 mm calculus is seen in the distal right
ureter, however there is no evidence of hydroureteronephrosis. No
left ureteral calculi or bladder calculi identified.

Stomach/Bowel: No evidence of obstruction, inflammatory process, or
abnormal fluid collections. Normal appendix visualized. Normal
appendix visualized.

Vascular/Lymphatic: No pathologically enlarged lymph nodes
identified. No evidence of abdominal aortic aneurysm.

Reproductive:  No mass or other significant abnormality.

Other:  None.

Musculoskeletal:  No suspicious bone lesions identified.
IMPRESSION: 5 mm distal right ureteral calculus. No evidence of right
hydronephrosis or ureteral dilatation.

Bilateral nonobstructing renal calculi.

## 2018-07-04 ENCOUNTER — Other Ambulatory Visit: Payer: Self-pay

## 2018-07-04 ENCOUNTER — Encounter: Payer: Self-pay | Admitting: Emergency Medicine

## 2018-07-04 ENCOUNTER — Ambulatory Visit
Admission: EM | Admit: 2018-07-04 | Discharge: 2018-07-04 | Disposition: A | Discharge status: Home / Self Care | Payer: BLUE CROSS/BLUE SHIELD | Attending: Emergency Medicine | Admitting: Emergency Medicine

## 2018-07-04 DIAGNOSIS — J40 Bronchitis, not specified as acute or chronic: Secondary | ICD-10-CM | POA: Insufficient documentation

## 2018-07-04 DIAGNOSIS — J069 Acute upper respiratory infection, unspecified: Secondary | ICD-10-CM

## 2018-07-04 DIAGNOSIS — B9789 Other viral agents as the cause of diseases classified elsewhere: Secondary | ICD-10-CM | POA: Insufficient documentation

## 2018-07-04 MED ORDER — HYDROCOD POLST-CPM POLST ER 10-8 MG/5ML PO SUER: 5.0000 mL | Freq: Every evening | ORAL | 0 refills | Status: DC | PRN

## 2018-07-04 MED ORDER — BENZONATATE 100 MG PO CAPS: 100.0000 mg | ORAL_CAPSULE | Freq: Three times a day (TID) | ORAL | 0 refills | Status: DC | PRN

## 2018-07-04 MED ORDER — DOXYCYCLINE HYCLATE 100 MG PO CAPS: 100.0000 mg | ORAL_CAPSULE | Freq: Two times a day (BID) | ORAL | 0 refills | Status: DC

## 2018-07-04 NOTE — ED Provider Notes (Signed)
[facility] ____________________________________________  Time seen: Approximately 8:38 AM  I have reviewed the triage vital signs and the nursing notes.   HISTORY  Chief Complaint Cough   HPI John Bauer is a 47 y.o. male presenting for evaluation of 1 week of cough and congestion symptoms.  States cough is intermittently keeping him up at night and causing him to toss and turn a lot.  Has not been taking over-the-counter medications for this.  States some coworkers at work recently sick with similar.  No home sick contacts.  Continues to drink fluids well.  States some intermittent sore throat, mostly with cough.  Denies accompanying fevers.  States has felt more tired with this.  Does have a history of pneumonia per patient.  Denies chest pain or shortness of breath.  Does report soreness from coughing so much.  Denies aggravating leaving factors.  Reports otherwise doing well.  Reubin Milan, MD: PCP   Past Medical History:  Diagnosis Date  . History of kidney stones   . Right ureteral stone   Pneumonia    Patient Active Problem List   Diagnosis Date Noted  . History of smoking 02/13/2018  . Kidney stones 02/13/2018  . Chronic fatigue 02/13/2018  . Chest pain 02/12/2018    Past Surgical History:  Procedure Laterality Date  . CHOLECYSTECTOMY  age 62's  . EXTRACORPOREAL SHOCK WAVE LITHOTRIPSY Right 01/26/2017   Procedure: EXTRACORPOREAL SHOCK WAVE LITHOTRIPSY (ESWL);  Surgeon: Orson Ape, MD;  Location: [facility];  Service: Urology;  Laterality: Right;  . EXTRACORPOREAL SHOCK WAVE LITHOTRIPSY Left 11/09/2017   Procedure: EXTRACORPOREAL SHOCK WAVE LITHOTRIPSY (ESWL);  Surgeon: Orson Ape, MD;  Location: [facility];  Service: Urology;  Laterality: Left;  . FOOT SURGERY Right 1995  . HOLMIUM LASER APPLICATION Right 01/03/2017   Procedure: HOLMIUM LASER APPLICATION;  Surgeon: Orson Ape, MD;  Location: [facility];  Service: Urology;   Laterality: Right;  . URETERAL STENT PLACEMENT  12/2016  . URETEROLITHOTOMY  2008  . URETEROSCOPY WITH HOLMIUM LASER LITHOTRIPSY Right 01/03/2017   Procedure: URETEROSCOPY WITH HOLMIUM LASER LITHOTRIPSY;  Surgeon: Orson Ape, MD;  Location: [facility];  Service: Urology;  Laterality: Right;     No current facility-administered medications for this encounter.   Current Outpatient Medications:  .  cetirizine (ZYRTEC) 10 MG tablet, Take 10 mg by mouth daily as needed for allergies., Disp: , Rfl:  .  benzonatate (TESSALON PERLES) 100 MG capsule, Take 1 capsule (100 mg total) by mouth 3 (three) times daily as needed for cough., Disp: 15 capsule, Rfl: 0 .  chlorpheniramine-HYDROcodone (TUSSIONEX PENNKINETIC ER) 10-8 MG/5ML SUER, Take 5 mLs by mouth at bedtime as needed for cough. do not drive or operate machinery while taking as can cause drowsiness., Disp: 50 mL, Rfl: 0 .  doxycycline (VIBRAMYCIN) 100 MG capsule, Take 1 capsule (100 mg total) by mouth 2 (two) times daily., Disp: 20 capsule, Rfl: 0  Allergies Patient has no known allergies.  Family History  Problem Relation Age of Onset  . Heart disease Mother   . Hypertension Mother   . Peripheral Artery Disease Mother 20  . Suicidality Father 42  . Depression Father     Social History Social History   Tobacco Use  . Smoking status: Former Smoker    Packs/day: 1.00    Years: 5.00    Pack years: 5.00    Types: Cigarettes    Last attempt to quit: 12/20/1993  Years since quitting: 24.5  . Smokeless tobacco: Current User    Types: Snuff, Chew  Substance Use Topics  . Alcohol use: Yes    Alcohol/week: 4.0 standard drinks    Types: 4 Standard drinks or equivalent per week    Comment: occasionally  . Drug use: Not Currently    Review of Systems Constitutional: No fever ENT: As above.  Cardiovascular: Denies chest pain. Respiratory: Denies shortness of breath. Gastrointestinal: No abdominal pain.  Musculoskeletal: Some  back soreness with cough. Skin: Negative for rash.   ____________________________________________   PHYSICAL EXAM:  VITAL SIGNS: ED Triage Vitals  Enc Vitals Group     BP 07/04/18 0816 132/89     Pulse Rate 07/04/18 0816 83     Resp 07/04/18 0816 18     Temp 07/04/18 0816 98.6 F (37 C)     Temp Source 07/04/18 0816 Oral     SpO2 07/04/18 0816 98 %     Weight 07/04/18 0814 245 lb (111.1 kg)     Height 07/04/18 0814 5\' 10"  (1.778 m)     Head Circumference --      Peak Flow --      Pain Score 07/04/18 0814 5     Pain Loc --      Pain Edu? --      Excl. in GC? --     Constitutional: Alert and oriented. Well appearing and in no acute distress. Eyes: Conjunctivae are normal.  Head: Atraumatic. No sinus tenderness to palpation. No swelling. No erythema.  Ears: no erythema, normal TMs bilaterally.   Nose:Nasal congestion   Mouth/Throat: Mucous membranes are moist. Mild pharyngeal erythema. No tonsillar swelling or exudate.  Neck: No stridor.  No cervical spine tenderness to palpation. Hematological/Lymphatic/Immunilogical: No cervical lymphadenopathy. Cardiovascular: Normal rate, regular rhythm. Grossly normal heart sounds.  Good peripheral circulation. Respiratory: Normal respiratory effort.  No retractions. No wheezes.  Mild scattered rhonchi.  Occasional cough noted. Good air movement.  Musculoskeletal: Ambulatory with steady gait. Neurologic:  Normal speech and language. No gait instability. Skin:  Skin appears warm, dry and intact. No rash noted. Psychiatric: Mood and affect are normal. Speech and behavior are normal.  ___________________________________________   LABS (all labs ordered are listed, but only abnormal results are displayed)  Labs Reviewed - No data to display   PROCEDURES Procedures    INITIAL IMPRESSION / ASSESSMENT AND PLAN / ED COURSE  Pertinent labs & imaging results that were available during my care of the patient were reviewed by me and  considered in my medical decision making (see chart for details).  Well-appearing patient.  No acute distress.  Suspect recent viral illness.  As symptoms continue with scattered rhonchi.  Will treat with doxycycline, PRN Tessalon Perles and PRN Tussionex.  Encourage rest, fluids, supportive care.Discussed indication, risks and benefits of medications with patient.  Discussed follow up with Primary care physician this week. Discussed follow up and return parameters including no resolution or any worsening concerns. Patient verbalized understanding and agreed to plan.   ____________________________________________   FINAL CLINICAL IMPRESSION(S) / ED DIAGNOSES  Final diagnoses:  Viral URI with cough  Bronchitis     ED Discharge Orders         Ordered    doxycycline (VIBRAMYCIN) 100 MG capsule  2 times daily     07/04/18 0832    benzonatate (TESSALON PERLES) 100 MG capsule  3 times daily PRN     07/04/18 0832    chlorpheniramine-HYDROcodone (  TUSSIONEX PENNKINETIC ER) 10-8 MG/5ML SUER  At bedtime PRN     07/04/18 [id number]           Note: This dictation was prepared with Dragon dictation along with smaller phrase technology. Any transcriptional errors that result from this process are unintentional.         Renford DillsMiller, Lydie Stammen, NP 07/04/18 [id number]

## 2018-07-04 NOTE — ED Triage Notes (Signed)
Pt c/o cough, chills, chest congestion and pain, wheezing. Cough is keeping him up at night. Started about a week ago.

## 2018-07-04 NOTE — Discharge Instructions (Signed)
Take medication as prescribed. Rest. Drink plenty of fluids.  ° °Follow up with your primary care physician this week as needed. Return to Urgent care for new or worsening concerns.  ° °

## 2018-07-06 ENCOUNTER — Ambulatory Visit (INDEPENDENT_AMBULATORY_CARE_PROVIDER_SITE_OTHER): Payer: BLUE CROSS/BLUE SHIELD | Admitting: Internal Medicine

## 2018-07-06 ENCOUNTER — Encounter: Payer: Self-pay | Admitting: Internal Medicine

## 2018-07-06 VITALS — BP 128/90 | HR 92 | Ht 69.0 in | Wt 264.0 lb

## 2018-07-06 DIAGNOSIS — J329 Chronic sinusitis, unspecified: Secondary | ICD-10-CM

## 2018-07-06 DIAGNOSIS — Z0000 Encounter for general adult medical examination without abnormal findings: Secondary | ICD-10-CM | POA: Diagnosis not present

## 2018-07-06 DIAGNOSIS — Z125 Encounter for screening for malignant neoplasm of prostate: Secondary | ICD-10-CM | POA: Diagnosis not present

## 2018-07-06 LAB — POCT URINALYSIS DIPSTICK
BILIRUBIN UA: NEGATIVE
Blood, UA: NEGATIVE
GLUCOSE UA: NEGATIVE
Ketones, UA: NEGATIVE
LEUKOCYTES UA: NEGATIVE
Nitrite, UA: NEGATIVE
Protein, UA: NEGATIVE
Spec Grav, UA: 1.025 (ref 1.010–1.025)
Urobilinogen, UA: 0.2 E.U./dL
pH, UA: 6 (ref 5.0–8.0)

## 2018-07-06 NOTE — Patient Instructions (Signed)
Begin taking Flonase daily If sinus infection recurs, would refer to ENT

## 2018-07-06 NOTE — Progress Notes (Signed)
Date:  07/06/2018   Name:  John Bauer   DOB:  1971-02-08   MRN:  [id number]   Chief Complaint: Annual Exam [name] is a 47 y.o. male who presents today for his Complete Annual Exam. He feels well except for sinus infection. He reports exercising some - physically active but no structured program. He reports he is sleeping fairly well.   Sinus Problem  This is a new problem. The current episode started in the past 7 days. The problem is unchanged. There has been no fever. Associated symptoms include congestion, coughing and sinus pressure. Pertinent negatives include no chills, headaches or shortness of breath. Past treatments include antibiotics. The treatment provided mild relief.  He states that this is his third sinus infection in the past 4 months.    Chest pain - seen this past summer by Cardiology.  Felt to be a low risk for CAD.  Coronary calcium scoring offered but declined.  He has had no further chest pain.  Review of Systems  Constitutional: Negative for chills, fatigue and fever.  HENT: Positive for congestion, sinus pressure and sinus pain. Negative for trouble swallowing.   Respiratory: Positive for cough. Negative for chest tightness, shortness of breath and wheezing.   Gastrointestinal: Negative for abdominal pain, blood in stool and constipation.  Genitourinary: Negative for difficulty urinating, frequency and urgency.       Nocturia x 1-2  Musculoskeletal: Negative for arthralgias and gait problem.  Skin: Negative for color change and rash.  Allergic/Immunologic: Negative for environmental allergies.  Neurological: Negative for dizziness, light-headedness and headaches.  Hematological: Negative for adenopathy.  Psychiatric/Behavioral: Negative for dysphoric mood and sleep disturbance.    Patient Active Problem List   Diagnosis Date Noted  . History of smoking 02/13/2018  . Kidney stones 02/13/2018  . Chronic fatigue 02/13/2018  . Chest pain  02/12/2018    No Known Allergies  Past Surgical History:  Procedure Laterality Date  . CHOLECYSTECTOMY  age 47's  . EXTRACORPOREAL SHOCK WAVE LITHOTRIPSY Right 01/26/2017   Procedure: EXTRACORPOREAL SHOCK WAVE LITHOTRIPSY (ESWL);  Surgeon: Orson ApeWolff, Michael R, MD;  Location: [facility];  Service: Urology;  Laterality: Right;  . EXTRACORPOREAL SHOCK WAVE LITHOTRIPSY Left 11/09/2017   Procedure: EXTRACORPOREAL SHOCK WAVE LITHOTRIPSY (ESWL);  Surgeon: Orson ApeWolff, Michael R, MD;  Location: [facility];  Service: Urology;  Laterality: Left;  . FOOT SURGERY Right 1995  . HOLMIUM LASER APPLICATION Right 01/03/2017   Procedure: HOLMIUM LASER APPLICATION;  Surgeon: Orson ApeWolff, Michael R, MD;  Location: [facility];  Service: Urology;  Laterality: Right;  . URETERAL STENT PLACEMENT  12/2016  . URETEROLITHOTOMY  2008  . URETEROSCOPY WITH HOLMIUM LASER LITHOTRIPSY Right 01/03/2017   Procedure: URETEROSCOPY WITH HOLMIUM LASER LITHOTRIPSY;  Surgeon: Orson ApeWolff, Michael R, MD;  Location: [facility];  Service: Urology;  Laterality: Right;    Social History   Tobacco Use  . Smoking status: Former Smoker    Packs/day: 1.00    Years: 5.00    Pack years: 5.00    Types: Cigarettes    Last attempt to quit: 12/20/1993    Years since quitting: 24.5  . Smokeless tobacco: Current User    Types: Snuff  Substance Use Topics  . Alcohol use: Yes    Alcohol/week: 4.0 standard drinks    Types: 4 Standard drinks or equivalent per week    Comment: occasionally  . Drug use: Not Currently     Medication list has been reviewed and updated.  Current Meds  Medication Sig  . cetirizine (ZYRTEC) 10 MG tablet Take 10 mg by mouth daily as needed for allergies.    PHQ 2/9 Scores 07/06/2018 02/12/2018  PHQ - 2 Score 0 0    Physical Exam Vitals signs and nursing note reviewed.  Constitutional:      Appearance: Normal appearance. He is well-developed.  HENT:     Head: Normocephalic.     Right Ear: Ear canal and external ear normal.  Tympanic membrane is retracted. Tympanic membrane is not erythematous.     Left Ear: Ear canal and external ear normal. Tympanic membrane is retracted. Tympanic membrane is not erythematous.     Nose:     Right Sinus: Maxillary sinus tenderness and frontal sinus tenderness present.     Left Sinus: Maxillary sinus tenderness and frontal sinus tenderness present.     Mouth/Throat:     Mouth: Mucous membranes are moist.     Pharynx: Uvula midline. No posterior oropharyngeal erythema.  Eyes:     Conjunctiva/sclera: Conjunctivae normal.     Pupils: Pupils are equal, round, and reactive to light.  Neck:     Musculoskeletal: Normal range of motion and neck supple.     Thyroid: No thyromegaly.     Vascular: No carotid bruit.  Cardiovascular:     Rate and Rhythm: Normal rate and regular rhythm.     Heart sounds: Normal heart sounds.  Pulmonary:     Effort: Pulmonary effort is normal.     Breath sounds: Normal breath sounds. No wheezing.  Chest:     Breasts:        Right: No mass.        Left: No mass.  Abdominal:     General: Bowel sounds are normal.     Palpations: Abdomen is soft.     Tenderness: There is no abdominal tenderness.  Musculoskeletal: Normal range of motion.  Lymphadenopathy:     Cervical: No cervical adenopathy.  Skin:    General: Skin is warm and dry.  Neurological:     Mental Status: He is alert and oriented to person, place, and time.     Cranial Nerves: Cranial nerves are intact.     Motor: Motor function is intact.     Deep Tendon Reflexes: Reflexes are normal and symmetric.  Psychiatric:        Attention and Perception: Attention normal.        Mood and Affect: Mood normal.        Speech: Speech normal.        Behavior: Behavior normal.        Thought Content: Thought content normal.        Judgment: Judgment normal.     BP 128/90 (BP Location: Right Arm, Patient Position: Sitting, Cuff Size: Large)   Pulse 92   Ht 5\' 9"  (1.753 m)   Wt 264 lb (119.7  kg)   SpO2 97%   BMI 38.99 kg/m   Assessment and Plan: 1. Annual physical exam - TSH  2. Prostate cancer screening DRE deferred - PSA  3. Recurrent sinus infections Finish antibiotics Begin flonase daily - may need ENT evaluation - CBC with Differential/Platelet  4. Sullivan LoneGilbert syndrome - Comprehensive metabolic panel   Partially dictated using [employer] software. Any errors are unintentional.  Bari EdwardLaura Garrett Bowring, MD [facility]  07/06/2018

## 2018-07-07 LAB — PSA: Prostate Specific Ag, Serum: 0.9 ng/mL (ref 0.0–4.0)

## 2018-07-07 LAB — COMPREHENSIVE METABOLIC PANEL
A/G RATIO: 1.8 (ref 1.2–2.2)
ALBUMIN: 4.6 g/dL (ref 3.5–5.5)
ALT: 38 IU/L (ref 0–44)
AST: 23 IU/L (ref 0–40)
Alkaline Phosphatase: 66 IU/L (ref 39–117)
BUN / CREAT RATIO: 14 (ref 9–20)
BUN: 16 mg/dL (ref 6–24)
Bilirubin Total: 1.2 mg/dL (ref 0.0–1.2)
CALCIUM: 9.5 mg/dL (ref 8.7–10.2)
CO2: 24 mmol/L (ref 20–29)
CREATININE: 1.12 mg/dL (ref 0.76–1.27)
Chloride: 100 mmol/L (ref 96–106)
GFR calc Af Amer: 89 mL/min/{1.73_m2} (ref >59)
GFR, EST NON AFRICAN AMERICAN: 77 mL/min/{1.73_m2} (ref >59)
GLOBULIN, TOTAL: 2.5 g/dL (ref 1.5–4.5)
Glucose: 97 mg/dL (ref 65–99)
POTASSIUM: 4.5 mmol/L (ref 3.5–5.2)
SODIUM: 140 mmol/L (ref 134–144)
Total Protein: 7.1 g/dL (ref 6.0–8.5)

## 2018-07-07 LAB — CBC WITH DIFFERENTIAL/PLATELET
BASOS ABS: 0.1 10*3/uL (ref 0.0–0.2)
BASOS: 1 %
EOS (ABSOLUTE): 0.3 10*3/uL (ref 0.0–0.4)
EOS: 6 %
Hematocrit: 47.1 % (ref 37.5–51.0)
Hemoglobin: 16.4 g/dL (ref 13.0–17.7)
IMMATURE GRANS (ABS): 0 10*3/uL (ref 0.0–0.1)
Immature Granulocytes: 0 %
LYMPHS ABS: 1.4 10*3/uL (ref 0.7–3.1)
LYMPHS: 30 %
MCH: 32 pg (ref 26.6–33.0)
MCHC: 34.8 g/dL (ref 31.5–35.7)
MCV: 92 fL (ref 79–97)
Monocytes Absolute: 0.7 10*3/uL (ref 0.1–0.9)
Monocytes: 14 %
NEUTROS ABS: 2.3 10*3/uL (ref 1.4–7.0)
Neutrophils: 49 %
Platelets: 298 10*3/uL (ref 150–450)
RBC: 5.13 x10E6/uL (ref 4.14–5.80)
RDW: 12.2 % — ABNORMAL LOW (ref 12.3–15.4)
WBC: 4.8 10*3/uL (ref 3.4–10.8)

## 2018-07-07 LAB — TSH: TSH: 1.61 u[IU]/mL (ref 0.450–4.500)

## 2018-08-26 ENCOUNTER — Emergency Department
Admission: EM | Admit: 2018-08-26 | Discharge: 2018-08-26 | Disposition: A | Discharge status: Home / Self Care | Payer: BLUE CROSS/BLUE SHIELD | Attending: Emergency Medicine | Admitting: Emergency Medicine

## 2018-08-26 ENCOUNTER — Encounter: Payer: Self-pay | Admitting: Emergency Medicine

## 2018-08-26 ENCOUNTER — Other Ambulatory Visit: Payer: Self-pay

## 2018-08-26 ENCOUNTER — Emergency Department: Payer: BLUE CROSS/BLUE SHIELD

## 2018-08-26 DIAGNOSIS — R109 Unspecified abdominal pain: Secondary | ICD-10-CM | POA: Diagnosis not present

## 2018-08-26 DIAGNOSIS — Z87891 Personal history of nicotine dependence: Secondary | ICD-10-CM | POA: Insufficient documentation

## 2018-08-26 DIAGNOSIS — N200 Calculus of kidney: Secondary | ICD-10-CM | POA: Diagnosis not present

## 2018-08-26 DIAGNOSIS — N132 Hydronephrosis with renal and ureteral calculous obstruction: Secondary | ICD-10-CM | POA: Diagnosis not present

## 2018-08-26 LAB — BASIC METABOLIC PANEL
ANION GAP: 7 (ref 5–15)
BUN: 20 mg/dL (ref 6–20)
CALCIUM: 9.4 mg/dL (ref 8.9–10.3)
CO2: 23 mmol/L (ref 22–32)
CREATININE: 0.98 mg/dL (ref 0.61–1.24)
Chloride: 106 mmol/L (ref 98–111)
Glucose, Bld: 126 mg/dL — ABNORMAL HIGH (ref 70–99)
Potassium: 3.7 mmol/L (ref 3.5–5.1)
Sodium: 136 mmol/L (ref 135–145)

## 2018-08-26 LAB — CBC WITH DIFFERENTIAL/PLATELET
Abs Immature Granulocytes: 0.01 10*3/uL (ref 0.00–0.07)
BASOS ABS: 0 10*3/uL (ref 0.0–0.1)
BASOS PCT: 1 %
EOS ABS: 0.1 10*3/uL (ref 0.0–0.5)
EOS PCT: 2 %
HCT: 46.6 % (ref 39.0–52.0)
Hemoglobin: 16.6 g/dL (ref 13.0–17.0)
IMMATURE GRANULOCYTES: 0 %
LYMPHS PCT: 33 %
Lymphs Abs: 1.8 10*3/uL (ref 0.7–4.0)
MCH: 32.1 pg (ref 26.0–34.0)
MCHC: 35.6 g/dL (ref 30.0–36.0)
MCV: 90.1 fL (ref 80.0–100.0)
Monocytes Absolute: 0.4 10*3/uL (ref 0.1–1.0)
Monocytes Relative: 8 %
NEUTROS ABS: 3 10*3/uL (ref 1.7–7.7)
Neutrophils Relative %: 56 %
PLATELETS: 320 10*3/uL (ref 150–400)
RBC: 5.17 MIL/uL (ref 4.22–5.81)
RDW: 12.1 % (ref 11.5–15.5)
WBC: 5.3 10*3/uL (ref 4.0–10.5)
nRBC: 0 % (ref 0.0–0.2)

## 2018-08-26 MED ORDER — KETOROLAC TROMETHAMINE 30 MG/ML IJ SOLN: 30.0000 mg | Freq: Once | INTRAMUSCULAR | Status: DC

## 2018-08-26 MED ORDER — MORPHINE SULFATE (PF) 4 MG/ML IV SOLN
4.0000 mg | Freq: Once | INTRAVENOUS | Status: AC
  Administered 2018-08-26: 4 mg via INTRAVENOUS
  Filled 2018-08-26: qty 1

## 2018-08-26 MED ORDER — ONDANSETRON HCL 4 MG/2ML IJ SOLN
4.0000 mg | Freq: Once | INTRAMUSCULAR | Status: AC
  Administered 2018-08-26: 4 mg via INTRAVENOUS
  Filled 2018-08-26: qty 2

## 2018-08-26 MED ORDER — OXYCODONE-ACETAMINOPHEN 5-325 MG PO TABS: 1.0000 | ORAL_TABLET | ORAL | 0 refills | Status: DC | PRN

## 2018-08-26 MED ORDER — ONDANSETRON HCL 4 MG PO TABS: 4.0000 mg | ORAL_TABLET | Freq: Three times a day (TID) | ORAL | 0 refills | Status: DC | PRN

## 2018-08-26 MED ORDER — SODIUM CHLORIDE 0.9 % IV BOLUS
1000.0000 mL | Freq: Once | INTRAVENOUS | Status: AC
  Administered 2018-08-26: 1000 mL via INTRAVENOUS

## 2018-08-26 MED ORDER — TAMSULOSIN HCL 0.4 MG PO CAPS: 0.4000 mg | ORAL_CAPSULE | Freq: Every day | ORAL | 0 refills | Status: DC

## 2018-08-26 MED ORDER — FENTANYL CITRATE (PF) 100 MCG/2ML IJ SOLN
50.0000 ug | INTRAMUSCULAR | Status: DC | PRN
  Administered 2018-08-26: 50 ug via INTRAVENOUS
  Filled 2018-08-26: qty 2

## 2018-08-26 MED ORDER — HYDROMORPHONE HCL 1 MG/ML IJ SOLN
1.0000 mg | Freq: Once | INTRAMUSCULAR | Status: AC
  Administered 2018-08-26: 1 mg via INTRAVENOUS
  Filled 2018-08-26: qty 1

## 2018-08-26 NOTE — ED Triage Notes (Signed)
Pt presents to ED via POV with c/o R flank pain. Pt states hx of kidney stones. Pt states pain worse with sitting at this time. Pt states difficulty urinating at this time.

## 2018-08-26 NOTE — Discharge Instructions (Signed)
Please seek medical attention for any high fevers, chest pain, shortness of breath, change in behavior, persistent vomiting, bloody stool or any other new or concerning symptoms.  

## 2018-08-26 NOTE — ED Notes (Signed)
Pt instructed to remain seated after administration of pain medication

## 2018-08-26 NOTE — ED Provider Notes (Signed)
[facility] Emergency Department Provider Note   ____________________________________________   I have reviewed the triage vital signs and the nursing notes.   HISTORY  Chief Complaint Flank Pain   History limited by: Not Limited   HPI John Bauer is a 48 y.o. male who presents to the emergency department today because of concerns for right flank pain.  Pain started yesterday.  Is located in the right flank and radiates down into his testicles.  It is severe.  Is been constant.  It has been associated with some nausea.  He feels like he is having a hard time initiating a urinary stream.  Has had similar pain in the past with previous kidney stones.  He has never been able to pass a kidney stone on his own and is required lithotripsy.  He denies any fevers.  Denies any trauma.   Per medical record review patient has a history of kidney stones, lithotripsy  Past Medical History:  Diagnosis Date  . Chest pain 02/12/2018  . History of kidney stones   . Right ureteral stone     Patient Active Problem List   Diagnosis Date Noted  . Recurrent sinus infections 07/06/2018  . Gilbert syndrome 07/06/2018  . History of smoking 02/13/2018  . Kidney stones 02/13/2018  . Chronic fatigue 02/13/2018    Past Surgical History:  Procedure Laterality Date  . CHOLECYSTECTOMY  age 30's  . EXTRACORPOREAL SHOCK WAVE LITHOTRIPSY Right 01/26/2017   Procedure: EXTRACORPOREAL SHOCK WAVE LITHOTRIPSY (ESWL);  Surgeon: Orson Ape, MD;  Location: [facility];  Service: Urology;  Laterality: Right;  . EXTRACORPOREAL SHOCK WAVE LITHOTRIPSY Left 11/09/2017   Procedure: EXTRACORPOREAL SHOCK WAVE LITHOTRIPSY (ESWL);  Surgeon: Orson Ape, MD;  Location: [facility];  Service: Urology;  Laterality: Left;  . FOOT SURGERY Right 1995  . HOLMIUM LASER APPLICATION Right 01/03/2017   Procedure: HOLMIUM LASER APPLICATION;  Surgeon: Orson Ape, MD;  Location: [facility];  Service:  Urology;  Laterality: Right;  . URETERAL STENT PLACEMENT  12/2016  . URETEROLITHOTOMY  2008  . URETEROSCOPY WITH HOLMIUM LASER LITHOTRIPSY Right 01/03/2017   Procedure: URETEROSCOPY WITH HOLMIUM LASER LITHOTRIPSY;  Surgeon: Orson Ape, MD;  Location: [facility];  Service: Urology;  Laterality: Right;    Prior to Admission medications   Medication Sig Start Date End Date Taking? Authorizing Provider  cetirizine (ZYRTEC) 10 MG tablet Take 10 mg by mouth daily as needed for allergies.    [provider]    Allergies Patient has no known allergies.  Family History  Problem Relation Age of Onset  . Heart disease Mother   . Hypertension Mother   . Peripheral Artery Disease Mother 53  . Suicidality Father 77  . Depression Father     Social History Social History   Tobacco Use  . Smoking status: Former Smoker    Packs/day: 1.00    Years: 5.00    Pack years: 5.00    Types: Cigarettes    Last attempt to quit: 12/20/1993    Years since quitting: 24.6  . Smokeless tobacco: Current User    Types: Snuff  Substance Use Topics  . Alcohol use: Yes    Alcohol/week: 4.0 standard drinks    Types: 4 Standard drinks or equivalent per week    Comment: occasionally  . Drug use: Not Currently    Review of Systems Constitutional: No fever/chills Eyes: No visual changes. ENT: No sore throat. Cardiovascular: Denies chest pain. Respiratory: Denies  shortness of breath. Gastrointestinal: Positive for right flank pain.  Genitourinary: Positive for difficulty initiating stream. Musculoskeletal: Negative for back pain. Skin: Negative for rash. Neurological: Negative for headaches, focal weakness or numbness.  ____________________________________________   PHYSICAL EXAM:  VITAL SIGNS: ED Triage Vitals [08/26/18 1131]  Enc Vitals Group     BP      Pulse      Resp      Temp      Temp src      SpO2      Weight 245 lb (111.1 kg)     Height 5\' 10"  (1.778 m)     Head  Circumference      Peak Flow      Pain Score 10     Pain Loc      Pain Edu?      Excl. in GC?      Constitutional: Alert and oriented. Appears uncomfortable.  Eyes: Conjunctivae are normal.  ENT      Head: Normocephalic and atraumatic.      Nose: No congestion/rhinnorhea.      Mouth/Throat: Mucous membranes are moist.      Neck: No stridor. Hematological/Lymphatic/Immunilogical: No cervical lymphadenopathy. Cardiovascular: Normal rate, regular rhythm.  No murmurs, rubs, or gallops.  Respiratory: Normal respiratory effort without tachypnea nor retractions. Breath sounds are clear and equal bilaterally. No wheezes/rales/rhonchi. Gastrointestinal: Soft. No rebound. No guarding. Right cva tenderness. Genitourinary: Deferred Musculoskeletal: Normal range of motion in all extremities. No lower extremity edema. Neurologic:  Normal speech and language. No gross focal neurologic deficits are appreciated.  Skin:  Skin is warm, dry and intact. No rash noted. Psychiatric: Mood and affect are normal. Speech and behavior are normal. Patient exhibits appropriate insight and judgment.  ____________________________________________    LABS (pertinent positives/negatives)  CBC wbc 5.3, hgb 16.6, plt 320 BMP wnl except glu 126  ____________________________________________   EKG  None  ____________________________________________    RADIOLOGY  CT renal 5 x 3 mm right UVJ stone  ____________________________________________   PROCEDURES  Procedures  ____________________________________________   INITIAL IMPRESSION / ASSESSMENT AND PLAN / ED COURSE  Pertinent labs & imaging results that were available during my care of the patient were reviewed by me and considered in my medical decision making (see chart for details).   Patient presented to the emergency department today because of concerns for right flank pain radiating into the testicles.  Does have a history of kidney  stones.  CT scan today does show a 5 x 3 mm UVJ stone.  Patient felt better with medications given here.  Will discharge with further pain medications Flomax and nausea medication.  Patient states he will follow-up with his urologist.  ____________________________________________   FINAL CLINICAL IMPRESSION(S) / ED DIAGNOSES  Final diagnoses:  Right flank pain  Kidney stone     Note: This dictation was prepared with Dragon dictation. Any transcriptional errors that result from this process are unintentional     Phineas SemenGoodman, Mahagony Grieb, MD 08/26/18 1436

## 2018-08-27 ENCOUNTER — Encounter
Admission: RE | Admit: 2018-08-27 | Discharge: 2018-08-27 | Disposition: A | Discharge status: Home / Self Care | Payer: BLUE CROSS/BLUE SHIELD | Source: Ambulatory Visit | Attending: Urology | Admitting: Urology

## 2018-08-27 ENCOUNTER — Other Ambulatory Visit: Payer: Self-pay

## 2018-08-27 DIAGNOSIS — N201 Calculus of ureter: Secondary | ICD-10-CM | POA: Diagnosis not present

## 2018-08-27 DIAGNOSIS — N281 Cyst of kidney, acquired: Secondary | ICD-10-CM | POA: Diagnosis not present

## 2018-08-27 HISTORY — DX: Gilbert syndrome: E80.4

## 2018-08-27 MED ORDER — CEFAZOLIN SODIUM-DEXTROSE 1-4 GM/50ML-% IV SOLN
1.0000 g | Freq: Once | INTRAVENOUS | Status: AC
  Administered 2018-08-28: 1 g via INTRAVENOUS

## 2018-08-27 NOTE — H&P (Signed)
NAME: John Bauer, [place] MEDICAL RECORD ZO:[id number] ACCOUNT [id number] DATE OF BIRTH:[birth year] FACILITY: [facility] LOCATION:  PHYSICIAN:Apolonia Ellwood Gilles Chiquito, MD  HISTORY AND PHYSICAL  DATE OF ADMISSION:  08/28/2018  CHIEF COMPLAINT:  Right flank pain.  HISTORY OF PRESENT ILLNESS:  The patient is a 48 year old white male with sudden onset of right flank pain associated with nausea on 02/09.  This prompted emergency room visit and CT scan was obtained.  The CT scan revealed a 5 x 3 mm distal right  ureteral stone with hydronephrosis and a distal left ureteral stone measuring 2 mm in size without hydronephrosis.  The patient comes in now for bilateral retrograde and possible bilateral ureteroscopic ureterolithotomy with holmium laser lithotripsy.  PAST MEDICAL HISTORY:    ALLERGIES:  No drug allergies.  CURRENT MEDICATIONS:  Nucynta.  PAST SURGICAL HISTORY: 1.  Cholecystectomy. 2.  Right foot surgery. 3.  Left ureteroscopic ureterolithotomy in 2008. 4.  Right ureteroscopic ureterolithotomy 2018. 5.  Right renal ESWL 2018. 6.  Left ureteral ESWL 2019.  PAST AND CURRENT MEDICAL CONDITIONS:  Recurrent kidney stone disease.  REVIEW OF SYSTEMS:  The patient denies chest pain, shortness of breath, diabetes, heart disease, stroke, hypertension or COPD.  SOCIAL HISTORY:  The patient denied tobacco or alcohol use.  FAMILY HISTORY:  Negative for urological disease.  PHYSICAL EXAMINATION: GENERAL:  A well-nourished, white male in no distress. HEENT:  Sclerae were clear.  Pupils are equally round, reactive to light and accommodation.  Extraocular movements were intact. PULMONARY:  Lungs clear to auscultation.   CARDIOVASCULAR:  Regular rhythm and rate without audible murmurs. ABDOMEN:  Soft, nontender abdomen.  No CVA tenderness. GENITOURINARY AND RECTAL:  Deferred NEUROMUSCULAR:  Alert and oriented x3.  IMPRESSION:  Bilateral ureterolithiasis with renal colic and  hydronephrosis.  PLAN: 1.  Bilateral retrograde pyelograms. 2.  Possible bilateral ureteroscopic ureterolithotomy with holmium laser lithotripsy.  TN/NUANCE  D:08/27/2018 T:08/27/2018 JOB:[id number]

## 2018-08-27 NOTE — Patient Instructions (Signed)
Your procedure is scheduled on:08/28/18  3:00 PM Report to Day Surgery.[facility] SECOND FLOOR   Remember: Instructions that are not followed completely may result in serious medical risk,  up to and including death, or upon the discretion of your surgeon and anesthesiologist your  surgery may need to be rescheduled.     _X__ 1. Do not eat food after midnight the night before your procedure.                 No gum chewing or hard candies. You may drink clear liquids up to 2 hours                 before you are scheduled to arrive for your surgery- DO not drink clear                 liquids within 2 hours of the start of your surgery.                 Clear Liquids include:  water, apple juice without pulp, clear carbohydrate                 drink such as Clearfast of Gatorade, Black Coffee or Tea (Do not add                 anything to coffee or tea).  __X__2.  On the morning of surgery brush your teeth with toothpaste and water, you                may rinse your mouth with mouthwash if you wish.  Do not swallow any toothpaste of mouthwash.     _X__ 3.  No Alcohol for 24 hours before or after surgery.   _X__ 4.  Do Not Smoke or use e-cigarettes For 24 Hours Prior to Your Surgery.                 Do not use any chewable tobacco products for at least 6 hours prior to                 surgery.  ____  5.  Bring all medications with you on the day of surgery if instructed.   _X___  6.  Notify your doctor if there is any change in your medical condition      (cold, fever, infections).     Do not wear jewelry, make-up, hairpins, clips or nail polish. Do not wear lotions, powders, or perfumes. You may wear deodorant. Do not shave 48 hours prior to surgery. Men may shave face and neck. Do not bring valuables to the hospital.    [facility] is not responsible for any belongings or valuables.  Contacts, dentures or bridgework may not be worn into surgery. Leave your  suitcase in the car. After surgery it may be brought to your room. For patients admitted to the hospital, discharge time is determined by your treatment team.   Patients discharged the day of surgery will not be allowed to drive home.    _X___ Take these medicines the morning of surgery with A SIP OF WATER:    1. FLOMAX  2.   3.   4.  5.  6.  ____ Fleet Enema (as directed)   ____ Use CHG Soap as directed  ____ Use inhalers on the day of surgery  ____ Stop metformin 2 days prior to surgery    ____ Take 1/2 of usual insulin dose the night before surgery. No  insulin the morning          of surgery.   ____ Stop Coumadin/Plavix/aspirin on   ____ Stop Anti-inflammatories on    ____ Stop supplements until after surgery.    ____ Bring C-Pap to the hospital.

## 2018-08-28 ENCOUNTER — Ambulatory Visit: Payer: BLUE CROSS/BLUE SHIELD | Admitting: Anesthesiology

## 2018-08-28 ENCOUNTER — Other Ambulatory Visit: Payer: Self-pay

## 2018-08-28 ENCOUNTER — Encounter
Admission: RE | Disposition: A | Discharge status: Home / Self Care | Payer: Self-pay | Source: Home / Self Care | Attending: Urology

## 2018-08-28 ENCOUNTER — Ambulatory Visit
Admission: RE | Admit: 2018-08-28 | Discharge: 2018-08-28 | Disposition: A | Discharge status: Home / Self Care | Payer: BLUE CROSS/BLUE SHIELD | Attending: Urology | Admitting: Urology

## 2018-08-28 DIAGNOSIS — N201 Calculus of ureter: Secondary | ICD-10-CM

## 2018-08-28 DIAGNOSIS — Z9049 Acquired absence of other specified parts of digestive tract: Secondary | ICD-10-CM | POA: Insufficient documentation

## 2018-08-28 DIAGNOSIS — Z87442 Personal history of urinary calculi: Secondary | ICD-10-CM | POA: Insufficient documentation

## 2018-08-28 DIAGNOSIS — N200 Calculus of kidney: Secondary | ICD-10-CM | POA: Diagnosis not present

## 2018-08-28 DIAGNOSIS — Z87891 Personal history of nicotine dependence: Secondary | ICD-10-CM | POA: Insufficient documentation

## 2018-08-28 DIAGNOSIS — N132 Hydronephrosis with renal and ureteral calculous obstruction: Secondary | ICD-10-CM | POA: Diagnosis not present

## 2018-08-28 HISTORY — PX: CYSTOSCOPY W/ RETROGRADES: SHX1426

## 2018-08-28 HISTORY — PX: URETEROSCOPY WITH HOLMIUM LASER LITHOTRIPSY: SHX6645

## 2018-08-28 SURGERY — URETEROSCOPY, WITH LITHOTRIPSY USING HOLMIUM LASER
Anesthesia: General | Laterality: Bilateral

## 2018-08-28 MED ORDER — DEXAMETHASONE SODIUM PHOSPHATE 10 MG/ML IJ SOLN
INTRAMUSCULAR | Status: DC | PRN
  Administered 2018-08-28: 10 mg via INTRAVENOUS

## 2018-08-28 MED ORDER — FUROSEMIDE 10 MG/ML IJ SOLN
10.0000 mg | Freq: Once | INTRAMUSCULAR | Status: AC
  Administered 2018-08-28: 10 mg via INTRAVENOUS

## 2018-08-28 MED ORDER — FENTANYL CITRATE (PF) 100 MCG/2ML IJ SOLN
INTRAMUSCULAR | Status: DC | PRN
  Administered 2018-08-28 (×2): 25 ug via INTRAVENOUS

## 2018-08-28 MED ORDER — EPHEDRINE SULFATE 50 MG/ML IJ SOLN
INTRAMUSCULAR | Status: DC | PRN
  Administered 2018-08-28: 10 mg via INTRAVENOUS

## 2018-08-28 MED ORDER — FENTANYL CITRATE (PF) 100 MCG/2ML IJ SOLN
INTRAMUSCULAR | Status: AC
  Filled 2018-08-28: qty 2

## 2018-08-28 MED ORDER — LACTATED RINGERS IV SOLN
INTRAVENOUS | Status: DC
  Administered 2018-08-28: 14:00:00 via INTRAVENOUS

## 2018-08-28 MED ORDER — FAMOTIDINE 20 MG PO TABS
20.0000 mg | ORAL_TABLET | Freq: Once | ORAL | Status: AC
  Administered 2018-08-28: 20 mg via ORAL

## 2018-08-28 MED ORDER — MIDAZOLAM HCL 2 MG/2ML IJ SOLN
INTRAMUSCULAR | Status: AC
  Filled 2018-08-28: qty 2

## 2018-08-28 MED ORDER — PROPOFOL 10 MG/ML IV BOLUS
INTRAVENOUS | Status: AC
  Filled 2018-08-28: qty 40

## 2018-08-28 MED ORDER — FAMOTIDINE 20 MG PO TABS
ORAL_TABLET | ORAL | Status: AC
  Filled 2018-08-28: qty 1

## 2018-08-28 MED ORDER — ONDANSETRON HCL 4 MG/2ML IJ SOLN: 4.0000 mg | Freq: Once | INTRAMUSCULAR | Status: DC | PRN

## 2018-08-28 MED ORDER — IOTHALAMATE MEGLUMINE 43 % IV SOLN
INTRAVENOUS | Status: DC | PRN
  Administered 2018-08-28: 20 mL via SURGICAL_CAVITY

## 2018-08-28 MED ORDER — LIDOCAINE HCL (CARDIAC) PF 100 MG/5ML IV SOSY
PREFILLED_SYRINGE | INTRAVENOUS | Status: DC | PRN
  Administered 2018-08-28: 100 mg via INTRAVENOUS

## 2018-08-28 MED ORDER — FENTANYL CITRATE (PF) 100 MCG/2ML IJ SOLN: 25.0000 ug | INTRAMUSCULAR | Status: DC | PRN

## 2018-08-28 MED ORDER — LIDOCAINE HCL URETHRAL/MUCOSAL 2 % EX GEL
CUTANEOUS | Status: DC | PRN
  Administered 2018-08-28: 1

## 2018-08-28 MED ORDER — ONDANSETRON HCL 4 MG/2ML IJ SOLN
INTRAMUSCULAR | Status: DC | PRN
  Administered 2018-08-28: 4 mg via INTRAVENOUS

## 2018-08-28 MED ORDER — PHENYLEPHRINE HCL 10 MG/ML IJ SOLN
INTRAMUSCULAR | Status: DC | PRN
  Administered 2018-08-28 (×2): 50 ug via INTRAVENOUS
  Administered 2018-08-28: 100 ug via INTRAVENOUS
  Administered 2018-08-28: 50 ug via INTRAVENOUS
  Administered 2018-08-28 (×2): 100 ug via INTRAVENOUS

## 2018-08-28 MED ORDER — BELLADONNA ALKALOIDS-OPIUM 16.2-60 MG RE SUPP
RECTAL | Status: AC
  Filled 2018-08-28: qty 1

## 2018-08-28 MED ORDER — PROPOFOL 10 MG/ML IV BOLUS
INTRAVENOUS | Status: DC | PRN
  Administered 2018-08-28: 200 mg via INTRAVENOUS

## 2018-08-28 MED ORDER — FUROSEMIDE 10 MG/ML IJ SOLN
INTRAMUSCULAR | Status: AC
  Administered 2018-08-28: 10 mg via INTRAVENOUS
  Filled 2018-08-28: qty 2

## 2018-08-28 MED ORDER — LIDOCAINE HCL URETHRAL/MUCOSAL 2 % EX GEL
CUTANEOUS | Status: AC
  Filled 2018-08-28: qty 10

## 2018-08-28 MED ORDER — BELLADONNA ALKALOIDS-OPIUM 16.2-60 MG RE SUPP
RECTAL | Status: DC | PRN
  Administered 2018-08-28: 1 via RECTAL

## 2018-08-28 MED ORDER — MIDAZOLAM HCL 2 MG/2ML IJ SOLN
INTRAMUSCULAR | Status: DC | PRN
  Administered 2018-08-28: 2 mg via INTRAVENOUS

## 2018-08-28 MED ORDER — GLYCOPYRROLATE 0.2 MG/ML IJ SOLN
INTRAMUSCULAR | Status: DC | PRN
  Administered 2018-08-28: 0.2 mg via INTRAVENOUS

## 2018-08-28 MED ORDER — KETOROLAC TROMETHAMINE 30 MG/ML IJ SOLN
INTRAMUSCULAR | Status: DC | PRN
  Administered 2018-08-28: 30 mg via INTRAVENOUS

## 2018-08-28 SURGICAL SUPPLY — 25 items
BAG DRAIN CYSTO-URO LG1000N (MISCELLANEOUS) ×2 IMPLANT
BRUSH SCRUB EZ 1% IODOPHOR (MISCELLANEOUS) ×2 IMPLANT
CATH URETL 5X70 OPEN END (CATHETERS) ×2 IMPLANT
CNTNR SPEC 2.5X3XGRAD LEK (MISCELLANEOUS)
CONRAY 43 FOR UROLOGY 50M (MISCELLANEOUS) ×2 IMPLANT
CONT SPEC 4OZ STER OR WHT (MISCELLANEOUS)
CONTAINER SPEC 2.5X3XGRAD LEK (MISCELLANEOUS) IMPLANT
COVER WAND RF STERILE (DRAPES) ×2 IMPLANT
FIBER LASER 365 (Laser) ×1 IMPLANT
GLOVE BIO SURGEON STRL SZ7.5 (GLOVE) ×2 IMPLANT
GOWN STRL REUS W/ TWL LRG LVL3 (GOWN DISPOSABLE) ×1 IMPLANT
GOWN STRL REUS W/ TWL LRG LVL4 (GOWN DISPOSABLE) ×1 IMPLANT
GOWN STRL REUS W/ TWL XL LVL3 (GOWN DISPOSABLE) ×1 IMPLANT
GOWN STRL REUS W/TWL LRG LVL3 (GOWN DISPOSABLE) ×1
GOWN STRL REUS W/TWL LRG LVL4 (GOWN DISPOSABLE) ×1
GOWN STRL REUS W/TWL XL LVL3 (GOWN DISPOSABLE) ×1
GUIDEWIRE STR ZIPWIRE 035X150 (MISCELLANEOUS) ×2 IMPLANT
KIT TURNOVER CYSTO (KITS) ×2 IMPLANT
PACK CYSTO AR (MISCELLANEOUS) ×2 IMPLANT
SET CYSTO W/LG BORE CLAMP LF (SET/KITS/TRAYS/PACK) ×2 IMPLANT
SOL .9 NS 3000ML IRR  AL (IV SOLUTION) ×1
SOL .9 NS 3000ML IRR UROMATIC (IV SOLUTION) ×1 IMPLANT
SURGILUBE 2OZ TUBE FLIPTOP (MISCELLANEOUS) ×2 IMPLANT
SYR 10ML LL (SYRINGE) ×2 IMPLANT
WATER STERILE IRR 1000ML POUR (IV SOLUTION) ×2 IMPLANT

## 2018-08-28 NOTE — Op Note (Signed)
Preoperative diagnosis: 1. bilateral ureterolithiasis                                             2.  Right hydronephrosis  Postoperative diagnosis: Left ureterolithiasis  Procedure: 1.  Left ureteroscopic ureterolithotomy with holmium laser lithotripsy                      2.  Bilateral retrograde pyelogram                      3.  Right ureteroscopy                      4.  Fluoroscopy   Surgeon: Suszanne Conners. Evelene Croon MD  Anesthesia: General  Indications:See the history and physical. After informed consent the above procedure(s) were requested     Technique and findings: After adequate general anesthesia been obtained the patient was placed into dorsal lithotomy position and the perineum was prepped and draped in the usual fashion.  21 French cystoscope was coupled with a camera and visually advanced into the bladder.  Bladder was thoroughly inspected and no abnormal lesions were identified.  Both ureteral orifices were identified and had clear efflux.  A 6 French open-ended ureteral catheter was then passed into the right orifice retrograde pyelography performed.  Filling defects, hydronephrosis or ureteral calculi were identified.  Retrograde pyelogram was repeated on the left side and a 3 mm distal left ureteral stone was identified.  The cystoscope was then removed.  At this point the short mini semirigid ureteroscope was coupled with a camera and visually advanced into the distal left ureter.  The calculus was identified the 365 m holmium laser fiber was then introduced to the scope power set at 0.5 W and a frequency of 10 Hz.  The stone was then powdered.  The ureteroscope was then removed from the left ureter and then passed into the right ureter.  The ureteroscope was easily passed into the proximal left ureter and no stones were lesions were identified.  The ureteroscope was then removed.  10 cc of viscous Xylocaine was instilled within the urethra.  A B&O suppository was placed.  Seizure  was then terminated and patient transferred to the recovery room in stable condition.

## 2018-08-28 NOTE — Anesthesia Post-op Follow-up Note (Signed)
Anesthesia QCDR form completed.        

## 2018-08-28 NOTE — Transfer of Care (Signed)
Immediate Anesthesia Transfer of Care Note  Patient: John Bauer  Procedure(s) Performed: URETEROSCOPY WITH HOLMIUM LASER LITHOTRIPSY (Bilateral ) CYSTOSCOPY WITH RETROGRADE PYELOGRAM (Bilateral )  Patient Location: PACU  Anesthesia Type:General  Level of Consciousness: awake, alert  and oriented  Airway & Oxygen Therapy: Patient Spontanous Breathing and Patient connected to face mask oxygen  Post-op Assessment: Report given to RN and Post -op Vital signs reviewed and stable  Post vital signs: Reviewed and stable  Last Vitals:  Vitals Value Taken Time  BP 134/90 08/28/2018  2:41 PM  Temp    Pulse 90 08/28/2018  2:45 PM  Resp 15 08/28/2018  2:45 PM  SpO2 98 % 08/28/2018  2:45 PM  Vitals shown include unvalidated device data.  Last Pain:  Vitals:   08/28/18 1441  TempSrc:   PainSc: 0-No pain         Complications: No apparent anesthesia complications

## 2018-08-28 NOTE — Anesthesia Preprocedure Evaluation (Signed)
Anesthesia Evaluation  Patient identified by MRN, date of birth, ID band Patient awake    Reviewed: Allergy & Precautions, H&P , NPO status , Patient's Chart, lab work & pertinent test results, reviewed documented beta blocker date and time   Airway Mallampati: II  TM Distance: >3 FB Neck ROM: full    Dental  (+) Teeth Intact   Pulmonary neg pulmonary ROS, former smoker,    Pulmonary exam normal        Cardiovascular Exercise Tolerance: Good negative cardio ROS Normal cardiovascular exam Rate:Normal     Neuro/Psych negative neurological ROS  negative psych ROS   GI/Hepatic negative GI ROS, Neg liver ROS,   Endo/Other  negative endocrine ROS  Renal/GU Renal disease  negative genitourinary   Musculoskeletal   Abdominal   Peds  Hematology negative hematology ROS (+)   Anesthesia Other Findings   Reproductive/Obstetrics negative OB ROS                             Anesthesia Physical Anesthesia Plan  ASA: II  Anesthesia Plan: General LMA   Post-op Pain Management:    Induction:   PONV Risk Score and Plan:   Airway Management Planned:   Additional Equipment:   Intra-op Plan:   Post-operative Plan:   Informed Consent: I have reviewed the patients History and Physical, chart, labs and discussed the procedure including the risks, benefits and alternatives for the proposed anesthesia with the patient or authorized representative who has indicated his/her understanding and acceptance.       Plan Discussed with: CRNA  Anesthesia Plan Comments:         Anesthesia Quick Evaluation

## 2018-08-28 NOTE — H&P (Signed)
Date of Initial H&P: 08/27/18  History reviewed, patient examined, no change in status, stable for surgery. 

## 2018-08-28 NOTE — Discharge Instructions (Addendum)
Laser Therapy for Kidney Stones, Care After Refer to this sheet in the next few weeks. These instructions provide you with information on caring for yourself after your procedure. Your health care provider may also give you more specific instructions. Your treatment has been planned according to current medical practices, but problems sometimes occur. Call your health care provider if you have any problems or questions after your procedure. What can I expect after the procedure? After the procedure, it is common to have:  Pain.  A burning sensation while urinating.  Small amounts of blood in your urine.  A need to urinate frequently.  Pieces of kidney stone in your urine.  Mild discomfort when urinating that is felt in the tip of the penis in men or in the back. You may experience this if you have a flexible tube (stent) in your ureter. Follow these instructions at home:  Take over-the-counter and prescription medicines only as told by your health care provider.  Take your antibiotic medicine as told by your health care provider. Do not stop taking the antibiotic even if you start to feel better.  Drink enough fluid to keep your urine clear or pale yellow. Your health care provider may recommend drinking two 8-ounce glasses of water per hour for a few hours after your procedure.  Return to your normal activities as told by your health care provider. Ask your health care provider what activities are safe for you.  If your health care provider approves, you may take a warm bath to ease discomfort and burning.  Do notdrivefor 24 hours if you were given a medicine to help you relax (sedative).  Keep all follow-up visits as told by your health care provider. This is important. If you have a stent, you will need to return to your health care provider to have the stent removed. Contact a health care provider if:  You have pain or a burning feeling that lasts more than two days.  You  feel nauseous.  You vomit more and more often.  You have difficulty urinating.  You have pain that gets worse or does not get better with medicine. Get help right away if:  You are unable to urinate, even if your bladder feels full.  You have bright red blood or blood clots in your urine.  You have more blood in your urine.  You have severe pain or discomfort.  You have a fever or shaking chills.  You have abdominal pain. This information is not intended to replace advice given to you by your health care provider. Make sure you discuss any questions you have with your health care provider. Document Released: 07/31/2015 Document Revised: 12/10/2015 Document Reviewed: 05/28/2015 Elsevier Interactive Patient Education  2019 [employer]   Kidney Stones  Kidney stones (urolithiasis) are solid, rock-like deposits that form inside of the organs that make urine (kidneys). A kidney stone may form in a kidney and move into the bladder, where it can cause intense pain and block the flow of urine. Kidney stones are created when high levels of certain minerals are found in the urine. They are usually passed through urination, but in some cases, medical treatment may be needed to remove them. What are the causes? Kidney stones may be caused by:  A condition in which certain glands produce too much parathyroid hormone (primary hyperparathyroidism), which causes too much calcium buildup in the blood.  Buildup of uric acid crystals in the bladder (hyperuricosuria). Uric acid is a chemical  that the body produces when you eat certain foods. It usually exits the body in the urine.  Narrowing (stricture) of one or both of the tubes that drain urine from the kidneys to the bladder (ureters).  A kidney blockage that is present at birth (congenital obstruction).  Past surgery on the kidney or the ureters, such as gastric bypass surgery. What increases the risk? The following factors make you more  likely to develop kidney stones:  Having had a kidney stone in the past.  Having a family history of kidney stones.  Not drinking enough water.  Eating a diet that is high in protein, salt (sodium), or sugar.  Being overweight or obese. What are the signs or symptoms? Symptoms of a kidney stone may include:  Nausea.  Vomiting.  Blood in the urine (hematuria).  Pain in the side of the abdomen, right below the ribs (flank pain). Pain usually spreads (radiates) to the groin.  Needing to urinate frequently or urgently. How is this diagnosed? This condition may be diagnosed based on:  Your medical history.  A physical exam.  Blood tests.  Urine tests.  CT scan.  Abdominal X-ray.  A procedure to examine the inside of the bladder (cystoscopy). How is this treated? Treatment for kidney stones depends on the size, location, and makeup of the stones. Treatment may involve:  Analyzing your urine before and after you pass the stone through urination.  Being monitored at the hospital until you pass the stone through urination.  Increasing your fluid intake and decreasing the amount of calcium and protein in your diet.  A procedure to break up kidney stones in the bladder using: ? A focused beam of light (laser therapy). ? Shock waves (extracorporeal shock wave lithotripsy).  Surgery to remove kidney stones. This may be needed if you have severe pain or have stones that block your urinary tract. Follow these instructions at home: Eating and drinking  Drink enough fluid to keep your urine clear or pale yellow. This will help you to pass the kidney stone.  If directed, change your diet. This may include: ? Limiting how much sodium you eat. ? Eating more fruits and vegetables. ? Limiting how much meat, poultry, fish, and eggs you eat.  Follow instructions from your health care provider about eating or drinking restrictions. General instructions  Collect urine samples  as told by your health care provider. You may need to collect a urine sample: ? 24 hours after you pass the stone. ? 8-12 weeks after passing the kidney stone, and every 6-12 months after that.  Strain your urine every time you urinate, for as long as directed. Use the strainer that your health care provider recommends.  Do not throw out the kidney stone after passing it. Keep the stone so it can be tested by your health care provider. Testing the makeup of your kidney stone may help prevent you from getting kidney stones in the future.  Take over-the-counter and prescription medicines only as told by your health care provider.  Keep all follow-up visits as told by your health care provider. This is important. You may need follow-up X-rays or ultrasounds to make sure that your stone has passed. How is this prevented? To prevent another kidney stone:  Drink enough fluid to keep your urine clear or pale yellow. This is the best way to prevent kidney stones.  Eat a healthy diet and follow recommendations from your health care provider about foods to avoid. You may  be instructed to eat a low-protein diet. Recommendations vary depending on the type of kidney stone that you have.  Maintain a healthy weight. Contact a health care provider if:  You have pain that gets worse or does not get better with medicine. Get help right away if:  You have a fever or chills.  You develop severe pain.  You develop new abdominal pain.  You faint.  You are unable to urinate. This information is not intended to replace advice given to you by your health care provider. Make sure you discuss any questions you have with your health care provider. Document Released: 07/04/2005 Document Revised: 12/15/2016 Document Reviewed: 12/18/2015 Elsevier Interactive Patient Education  2019 [employer]   AMBULATORY SURGERY  DISCHARGE INSTRUCTIONS   1) The drugs that you were given will stay in your system until  tomorrow so for the next 24 hours you should not:  A) Drive an automobile B) Make any legal decisions C) Drink any alcoholic beverage   2) You may resume regular meals tomorrow.  Today it is better to start with liquids and gradually work up to solid foods.  You may eat anything you prefer, but it is better to start with liquids, then soup and crackers, and gradually work up to solid foods.   3) Please notify your doctor immediately if you have any unusual bleeding, trouble breathing, redness and pain at the surgery site, drainage, fever, or pain not relieved by medication.    4) Additional Instructions:        Please contact your physician with any problems or Same Day Surgery at [phone], Monday through Friday 6 am to 4 pm, or [place] [facility] Main number at [phone].

## 2018-08-28 NOTE — Anesthesia Procedure Notes (Signed)
Procedure Name: LMA Insertion Date/Time: 08/28/2018 1:53 PM Performed by: Riccardo DubinMills, Junko Ohagan W, CRNA Pre-anesthesia Checklist: Patient identified, Patient being monitored, Timeout performed, Emergency Drugs available and Suction available Patient Re-evaluated:Patient Re-evaluated prior to induction Oxygen Delivery Method: Circle system utilized Preoxygenation: Pre-oxygenation with 100% oxygen Induction Type: IV induction Ventilation: Mask ventilation without difficulty LMA: LMA inserted LMA Size: 4.5 Tube type: Oral Number of attempts: 1 Placement Confirmation: positive ETCO2 and breath sounds checked- equal and bilateral Tube secured with: Tape Dental Injury: Teeth and Oropharynx as per pre-operative assessment

## 2018-08-29 ENCOUNTER — Emergency Department
Admission: EM | Admit: 2018-08-29 | Discharge: 2018-08-29 | Disposition: A | Discharge status: Home / Self Care | Payer: BLUE CROSS/BLUE SHIELD | Attending: Emergency Medicine | Admitting: Emergency Medicine

## 2018-08-29 ENCOUNTER — Other Ambulatory Visit: Payer: Self-pay

## 2018-08-29 ENCOUNTER — Encounter: Payer: Self-pay | Admitting: Emergency Medicine

## 2018-08-29 DIAGNOSIS — R339 Retention of urine, unspecified: Secondary | ICD-10-CM

## 2018-08-29 DIAGNOSIS — Z79899 Other long term (current) drug therapy: Secondary | ICD-10-CM | POA: Insufficient documentation

## 2018-08-29 DIAGNOSIS — R319 Hematuria, unspecified: Secondary | ICD-10-CM | POA: Diagnosis not present

## 2018-08-29 DIAGNOSIS — Z87891 Personal history of nicotine dependence: Secondary | ICD-10-CM | POA: Insufficient documentation

## 2018-08-29 MED ORDER — SODIUM CHLORIDE 0.9 % IV BOLUS
1000.0000 mL | Freq: Once | INTRAVENOUS | Status: AC
  Administered 2018-08-29: 1000 mL via INTRAVENOUS

## 2018-08-29 MED ORDER — MORPHINE SULFATE (PF) 2 MG/ML IV SOLN
INTRAVENOUS | Status: AC
  Administered 2018-08-29: 2 mg via INTRAVENOUS
  Filled 2018-08-29: qty 1

## 2018-08-29 MED ORDER — SULFAMETHOXAZOLE-TRIMETHOPRIM 800-160 MG PO TABS: 1.0000 | ORAL_TABLET | Freq: Two times a day (BID) | ORAL | 0 refills | Status: AC

## 2018-08-29 MED ORDER — MORPHINE SULFATE (PF) 2 MG/ML IV SOLN
2.0000 mg | Freq: Once | INTRAVENOUS | Status: AC
  Administered 2018-08-29: 2 mg via INTRAVENOUS

## 2018-08-29 MED ORDER — LIDOCAINE HCL URETHRAL/MUCOSAL 2 % EX GEL
1.0000 "application " | Freq: Once | CUTANEOUS | Status: AC
  Administered 2018-08-29: 1 via URETHRAL
  Filled 2018-08-29: qty 10

## 2018-08-29 NOTE — ED Triage Notes (Signed)
Patient ambulatory to triage with steady gait, without difficulty, appears uncomfortable; Unable to void since 6pm; only passing "thick blood"

## 2018-08-29 NOTE — ED Notes (Signed)
Pt given water and ice to try to continue to hydrate pt. Pt able to pee out 100 cc of urine/blood clots in urinal. Will continue to monitor.

## 2018-08-29 NOTE — ED Notes (Signed)
Continuous bladder irrigation done by this RN and Raquel, RN. 22 Fr three way catheter was inserted in order to start irrigation. Multiple blood clots were passed by the patient at the beginning of the irrigation and fluid was inserted while urine was flowing. After period of time, the flow of urine stopped appearing to be clotted off and fluid was stopped. Raquel, RN attempted to flush catheter multiple times in order to get blood clots away from catheter with minimal results. After receiving 800 cc of fluid in catheter and an output of 1600 cc, MD Manson Passey decided to stop irrigation and bladder scan. Bladder scan resulted in 0 cc in urethra. MD Manson Passey ordered pain medication and 1 L NS in order to rehydrate patient and then possibly take out catheter to see if patient could pee. Will continue to monitor.

## 2018-08-29 NOTE — ED Provider Notes (Signed)
[facility] Emergency Department Provider Note  ____________________________________________   First MD Initiated Contact with Patient 08/29/18 [phone]     (approximate)  I have reviewed the triage vital signs and the nursing notes.   HISTORY  Chief Complaint Post-op Problem    HPI John Bauer is a 48 y.o. male with below list of chronic medical conditions including multiple kidney stone status post laser lithotripsy performed by Dr. Sheppard PentonWolf yesterday presents to the emergency department with inability to urinate since 6:00 PM yesterday.  Patient states that he had multiple episodes of urination with large clots expelled and then abruptly's lost the ability to urinate at 6 PM.  Patient admits to pelvic discomfort but current pain score 8 out of 10.  Patient's wife showed a picture of a large clot that was passed at home approximately size of a quarter.   Past Medical History:  Diagnosis Date  . Chest pain 02/12/2018  . Gilbert syndrome   . History of kidney stones   . Right ureteral stone     Patient Active Problem List   Diagnosis Date Noted  . Recurrent sinus infections 07/06/2018  . Gilbert syndrome 07/06/2018  . History of smoking 02/13/2018  . Kidney stones 02/13/2018  . Chronic fatigue 02/13/2018    Past Surgical History:  Procedure Laterality Date  . CHOLECYSTECTOMY  age 48's  . EXTRACORPOREAL SHOCK WAVE LITHOTRIPSY Right 01/26/2017   Procedure: EXTRACORPOREAL SHOCK WAVE LITHOTRIPSY (ESWL);  Surgeon: Orson ApeWolff, Michael R, MD;  Location: [facility] ORS;  Service: Urology;  Laterality: Right;  . EXTRACORPOREAL SHOCK WAVE LITHOTRIPSY Left 11/09/2017   Procedure: EXTRACORPOREAL SHOCK WAVE LITHOTRIPSY (ESWL);  Surgeon: Orson ApeWolff, Michael R, MD;  Location: [facility] ORS;  Service: Urology;  Laterality: Left;  . FOOT SURGERY Right 1995  . HOLMIUM LASER APPLICATION Right 01/03/2017   Procedure: HOLMIUM LASER APPLICATION;  Surgeon: Orson ApeWolff, Michael R, MD;  Location: [facility]  ORS;  Service: Urology;  Laterality: Right;  . URETERAL STENT PLACEMENT  12/2016  . URETEROLITHOTOMY  2008  . URETEROSCOPY WITH HOLMIUM LASER LITHOTRIPSY Right 01/03/2017   Procedure: URETEROSCOPY WITH HOLMIUM LASER LITHOTRIPSY;  Surgeon: Orson ApeWolff, Michael R, MD;  Location: [facility] ORS;  Service: Urology;  Laterality: Right;    Prior to Admission medications   Medication Sig Start Date End Date Taking? Authorizing Provider  tamsulosin (FLOMAX) 0.4 MG CAPS capsule Take 1 capsule (0.4 mg total) by mouth daily. 08/26/18  Yes Phineas SemenGoodman, Graydon, MD  tapentadol (NUCYNTA) 50 MG tablet Take 50 mg by mouth 2 (two) times daily as needed for moderate pain.   Yes [provider]  ondansetron (ZOFRAN) 4 MG tablet Take 1 tablet (4 mg total) by mouth every 8 (eight) hours as needed. Patient not taking: Reported on 08/28/2018 08/26/18   Phineas SemenGoodman, Graydon, MD  oxyCODONE-acetaminophen (PERCOCET) 5-325 MG tablet Take 1 tablet by mouth every 4 (four) hours as needed for severe pain. Patient not taking: Reported on 08/28/2018 08/26/18 08/26/19  Phineas SemenGoodman, Graydon, MD    Allergies Patient has no known allergies.  Family History  Problem Relation Age of Onset  . Heart disease Mother   . Hypertension Mother   . Peripheral Artery Disease Mother [id number]  . Suicidality Father [id number]  . Depression Father     Social History Social History   Tobacco Use  . Smoking status: Former Smoker    Packs/day: 1.00    Years: 5.00    Pack years: 5.00    Types: Cigarettes    Last attempt  to quit: 12/20/1993    Years since quitting: 24.7  . Smokeless tobacco: Current User    Types: Snuff  Substance Use Topics  . Alcohol use: Yes    Alcohol/week: 4.0 standard drinks    Types: 4 Standard drinks or equivalent per week    Comment: occasionally  . Drug use: Not Currently    Review of Systems Constitutional: No fever/chills Eyes: No visual changes. ENT: No sore throat. Cardiovascular: Denies chest pain. Respiratory: Denies shortness  of breath. Gastrointestinal: No abdominal pain.  No nausea, no vomiting.  No diarrhea.  No constipation. Genitourinary: Positive for hematuria and urinary retention Musculoskeletal: Negative for neck pain.  Negative for back pain. Integumentary: Negative for rash. Neurological: Negative for headaches, focal weakness or numbness.   ____________________________________________   PHYSICAL EXAM:  VITAL SIGNS: ED Triage Vitals  Enc Vitals Group     BP 08/29/18 0319 135/83     Pulse Rate 08/29/18 0319 91     Resp 08/29/18 0319 20     Temp 08/29/18 0319 98.1 F (36.7 C)     Temp Source 08/29/18 0319 Oral     SpO2 08/29/18 0319 98 %     Weight 08/29/18 0319 111.1 kg (245 lb)     Height 08/29/18 0319 1.778 m (5\' 10" )     Head Circumference --      Peak Flow --      Pain Score 08/29/18 0317 7     Pain Loc --      Pain Edu? --      Excl. in GC? --     Constitutional: Alert and oriented.  Apparent discomfort eyes: Conjunctivae are normal.  Mouth/Throat: Mucous membranes are moist.  Oropharynx non-erythematous. Neck: No stridor.   Cardiovascular: Normal rate, regular rhythm. Good peripheral circulation. Grossly normal heart sounds. Respiratory: Normal respiratory effort.  No retractions. Lungs CTAB. Gastrointestinal: Soft and nontender. No distention.  Genitourinary: Hematuria noted external urethral meatus. Musculoskeletal: No lower extremity tenderness nor edema. No gross deformities of extremities. Neurologic:  Normal speech and language. No gross focal neurologic deficits are appreciated.  Skin:  Skin is warm, dry and intact. No rash noted.     Procedures   ____________________________________________   INITIAL IMPRESSION / ASSESSMENT AND PLAN / ED COURSE  As part of my medical decision making, I reviewed the following data within the electronic MEDICAL RECORD NUMBER   48 year old male presenting with above-stated history and physical exam secondary to hematuria and urinary  retention.  Bladder scan revealed greater than 600 mL's of urine and as such three-way catheter placed with multiple clots visualized with continuous bladder irrigation.  Following removal of the three-way catheter patient passed 2 large clots and subsequently was able to urinate on his own without difficulty.  Patient without discomfort at this time. ____________________________________________  FINAL CLINICAL IMPRESSION(S) / ED DIAGNOSES  Final diagnoses:  Hematuria, unspecified type  Urinary retention     MEDICATIONS GIVEN DURING THIS VISIT:  Medications  sodium chloride 0.9 % bolus 1,000 mL (1,000 mLs Intravenous New Bag/Given 08/29/18 0539)  lidocaine (XYLOCAINE) 2 % jelly 1 application (1 application Urethral Given 08/29/18 0338)  sodium chloride 0.9 % bolus 1,000 mL (0 mLs Intravenous Stopped 08/29/18 0519)  morphine 2 MG/ML injection 2 mg (2 mg Intravenous Given 08/29/18 0419)  morphine 2 MG/ML injection 2 mg (2 mg Intravenous Given 08/29/18 0446)     ED Discharge Orders    None       Note:  This  document was prepared using Conservation officer, historic buildings and may include unintentional dictation errors.   Darci Current, MD 08/29/18 [phone]

## 2018-08-29 NOTE — ED Notes (Signed)
Catheter removed by this RN with MD Manson Passey at bedside. Pt was able to push multiple large clots out of bladder as soon as catheter was removed. Pt stated that he did not feel the pressure and discomfort that he had felt before when the catheter was still in. Pt stated that he wanted to sit there for a minute and see if he could pee again after fluids were finished. MD and patient in agreement with plan. Will continue to monitor.

## 2018-08-29 NOTE — OR Nursing (Signed)
Patient called around 2320 with concern that he could not void.  Asked how long it has been?  He told me about six hours.  Advised that he should call Dr. Evelene Croon and let him know what was happening.  He told me he had spoke to Dr. Evelene Croon about 3 hours earlier.  Advised then, to make sure he was drinking plenty of fluids and if he still could not void to come to the ED.

## 2018-08-30 NOTE — Anesthesia Postprocedure Evaluation (Signed)
Anesthesia Post Note  Patient: John Bauer  Procedure(s) Performed: URETEROSCOPY WITH HOLMIUM LASER LITHOTRIPSY (Bilateral ) CYSTOSCOPY WITH RETROGRADE PYELOGRAM (Bilateral )  Patient location during evaluation: PACU Anesthesia Type: General Level of consciousness: awake and alert Pain management: pain level controlled Vital Signs Assessment: post-procedure vital signs reviewed and stable Respiratory status: spontaneous breathing, nonlabored ventilation, respiratory function stable and patient connected to nasal cannula oxygen Cardiovascular status: blood pressure returned to baseline and stable Postop Assessment: no apparent nausea or vomiting Anesthetic complications: no     Last Vitals:  Vitals:   08/28/18 1523 08/28/18 1532  BP: 138/82 125/84  Pulse: 69 67  Resp: 19 16  Temp: (!) 36.3 C (!) 36.1 C  SpO2: 98% 99%    Last Pain:  Vitals:   08/28/18 1532  TempSrc: Temporal  PainSc: 0-No pain                 Yevette Edwards

## 2018-09-04 DIAGNOSIS — N201 Calculus of ureter: Secondary | ICD-10-CM | POA: Diagnosis not present

## 2018-10-08 DIAGNOSIS — M545 Low back pain: Secondary | ICD-10-CM | POA: Diagnosis not present

## 2018-10-08 DIAGNOSIS — M5416 Radiculopathy, lumbar region: Secondary | ICD-10-CM | POA: Diagnosis not present

## 2019-05-18 DIAGNOSIS — Z23 Encounter for immunization: Secondary | ICD-10-CM | POA: Diagnosis not present

## 2019-07-15 ENCOUNTER — Other Ambulatory Visit: Payer: Self-pay

## 2019-07-15 ENCOUNTER — Encounter: Payer: Self-pay | Admitting: Internal Medicine

## 2019-07-15 ENCOUNTER — Ambulatory Visit (INDEPENDENT_AMBULATORY_CARE_PROVIDER_SITE_OTHER): Payer: BC Managed Care – PPO | Admitting: Internal Medicine

## 2019-07-15 VITALS — BP 126/84 | HR 85 | Temp 98.1°F | Ht 70.0 in | Wt 257.4 lb

## 2019-07-15 DIAGNOSIS — Z87891 Personal history of nicotine dependence: Secondary | ICD-10-CM | POA: Diagnosis not present

## 2019-07-15 DIAGNOSIS — I10 Essential (primary) hypertension: Secondary | ICD-10-CM | POA: Diagnosis not present

## 2019-07-15 DIAGNOSIS — Z125 Encounter for screening for malignant neoplasm of prostate: Secondary | ICD-10-CM | POA: Diagnosis not present

## 2019-07-15 DIAGNOSIS — Z0000 Encounter for general adult medical examination without abnormal findings: Secondary | ICD-10-CM

## 2019-07-15 NOTE — Patient Instructions (Signed)

## 2019-07-15 NOTE — Progress Notes (Signed)
Date:  07/15/2019   Name:  John Bauer   DOB:  1970/12/08   MRN:  [id number]   Chief Complaint: Annual Exam John LabradorJames P Aurea Bauer is a 48 y.o. male who presents today for his Complete Annual Exam. He feels well. He reports exercising -no regular program. He reports he is sleeping fairly well.  He had another kidney stone this year.  Currently symptom free. He is not currently having any sinus issues.  He uses Flonase PRN.  Immunization History  Administered Date(s) Administered  . Influenza,inj,Quad PF,6+ Mos 06/05/2019  . Influenza-Unspecified 07/01/2018    HPI  Lab Results  Component Value Date   CREATININE 0.98 08/26/2018   BUN 20 08/26/2018   NA 136 08/26/2018   K 3.7 08/26/2018   CL 106 08/26/2018   CO2 23 08/26/2018   Lab Results  Component Value Date   CHOL 136 02/12/2018   HDL 55 02/12/2018   LDLCALC 66 02/12/2018   TRIG 75 02/12/2018   CHOLHDL 2.5 02/12/2018   Lab Results  Component Value Date   TSH 1.610 07/06/2018   No results found for: HGBA1C   Review of Systems  Constitutional: Negative for appetite change, chills, diaphoresis, fatigue and unexpected weight change.  HENT: Negative for congestion, hearing loss, tinnitus, trouble swallowing and voice change.   Eyes: Negative for visual disturbance.  Respiratory: Negative for choking, shortness of breath and wheezing.   Cardiovascular: Negative for chest pain, palpitations and leg swelling.  Gastrointestinal: Negative for abdominal pain, blood in stool, constipation and diarrhea.  Genitourinary: Negative for difficulty urinating, dysuria and frequency.  Musculoskeletal: Positive for arthralgias (general intermittent). Negative for back pain and myalgias.  Skin: Negative for color change and rash.  Allergic/Immunologic: Positive for environmental allergies.  Neurological: Negative for dizziness, syncope and headaches.  Hematological: Negative for adenopathy.  Psychiatric/Behavioral: Positive for  sleep disturbance (difficulty falling asleep). Negative for dysphoric mood.    Patient Active Problem List   Diagnosis Date Noted  . Recurrent sinus infections 07/06/2018  . Gilbert syndrome 07/06/2018  . History of smoking 02/13/2018  . Kidney stones 02/13/2018  . Chronic fatigue 02/13/2018    No Known Allergies  Past Surgical History:  Procedure Laterality Date  . CHOLECYSTECTOMY  age 48's  . CYSTOSCOPY W/ RETROGRADES Bilateral 08/28/2018   Procedure: CYSTOSCOPY WITH RETROGRADE PYELOGRAM;  Surgeon: Orson ApeWolff, Michael R, MD;  Location: [facility] ORS;  Service: Urology;  Laterality: Bilateral;  . EXTRACORPOREAL SHOCK WAVE LITHOTRIPSY Right 01/26/2017   Procedure: EXTRACORPOREAL SHOCK WAVE LITHOTRIPSY (ESWL);  Surgeon: Orson ApeWolff, Michael R, MD;  Location: [facility] ORS;  Service: Urology;  Laterality: Right;  . EXTRACORPOREAL SHOCK WAVE LITHOTRIPSY Left 11/09/2017   Procedure: EXTRACORPOREAL SHOCK WAVE LITHOTRIPSY (ESWL);  Surgeon: Orson ApeWolff, Michael R, MD;  Location: [facility] ORS;  Service: Urology;  Laterality: Left;  . FOOT SURGERY Right 1995  . HOLMIUM LASER APPLICATION Right 01/03/2017   Procedure: HOLMIUM LASER APPLICATION;  Surgeon: Orson ApeWolff, Michael R, MD;  Location: [facility] ORS;  Service: Urology;  Laterality: Right;  . URETERAL STENT PLACEMENT  12/2016  . URETEROLITHOTOMY  2008  . URETEROSCOPY WITH HOLMIUM LASER LITHOTRIPSY Right 01/03/2017   Procedure: URETEROSCOPY WITH HOLMIUM LASER LITHOTRIPSY;  Surgeon: Orson ApeWolff, Michael R, MD;  Location: [facility] ORS;  Service: Urology;  Laterality: Right;  . URETEROSCOPY WITH HOLMIUM LASER LITHOTRIPSY Bilateral 08/28/2018   Procedure: URETEROSCOPY WITH HOLMIUM LASER LITHOTRIPSY;  Surgeon: Orson ApeWolff, Michael R, MD;  Location: [facility] ORS;  Service: Urology;  Laterality: Bilateral;    Social  History   Tobacco Use  . Smoking status: Former Smoker    Packs/day: 1.00    Years: 5.00    Pack years: 5.00    Types: Cigarettes    Quit date: 12/20/1993    Years since quitting: 25.5  .  Smokeless tobacco: Current User    Types: Snuff  Substance Use Topics  . Alcohol use: Yes    Alcohol/week: 4.0 standard drinks    Types: 4 Standard drinks or equivalent per week    Comment: occasionally  . Drug use: Not Currently     Medication list has been reviewed and updated.  Current Meds  Medication Sig  . fluticasone (FLONASE) 50 MCG/ACT nasal spray Place into both nostrils daily.    PHQ 2/9 Scores 07/15/2019 07/06/2018 02/12/2018  PHQ - 2 Score 0 0 0  PHQ- 9 Score 0 - -    BP Readings from Last 3 Encounters:  07/15/19 126/84  08/29/18 (!) 142/86  08/28/18 125/84    Physical Exam Vitals and nursing note reviewed.  Constitutional:      Appearance: Normal appearance. He is well-developed.  HENT:     Head: Normocephalic.     Right Ear: Tympanic membrane, ear canal and external ear normal.     Left Ear: Tympanic membrane, ear canal and external ear normal.     Nose: Nose normal.     Mouth/Throat:     Pharynx: Uvula midline.  Eyes:     Conjunctiva/sclera: Conjunctivae normal.     Pupils: Pupils are equal, round, and reactive to light.  Neck:     Thyroid: No thyromegaly.     Vascular: No carotid bruit.  Cardiovascular:     Rate and Rhythm: Normal rate and regular rhythm.     Pulses: Normal pulses.     Heart sounds: Normal heart sounds.  Pulmonary:     Effort: Pulmonary effort is normal.     Breath sounds: Normal breath sounds. No wheezing.  Chest:     Breasts:        Right: No mass.        Left: No mass.  Abdominal:     General: Bowel sounds are normal.     Palpations: Abdomen is soft.     Tenderness: There is no abdominal tenderness. There is no guarding or rebound.  Musculoskeletal:     Cervical back: Normal range of motion and neck supple.     Right lower leg: No edema.     Left lower leg: No edema.  Lymphadenopathy:     Cervical: No cervical adenopathy.  Skin:    General: Skin is warm and dry.     Capillary Refill: Capillary refill takes less  than 2 seconds.  Neurological:     General: No focal deficit present.     Mental Status: He is alert and oriented to person, place, and time.     Deep Tendon Reflexes: Reflexes are normal and symmetric.  Psychiatric:        Speech: Speech normal.        Behavior: Behavior normal.        Thought Content: Thought content normal.        Judgment: Judgment normal.     Wt Readings from Last 3 Encounters:  07/15/19 257 lb 6.4 oz (116.8 kg)  08/29/18 245 lb (111.1 kg)  08/26/18 245 lb (111.1 kg)    BP 126/84   Pulse 85   Temp 98.1 F (36.7 C)   Ht 5'  10" (1.778 m)   Wt 257 lb 6.4 oz (116.8 kg)   SpO2 94%   BMI 36.93 kg/m   Assessment and Plan: 1. Annual physical exam Normal exam except for weight Resume healthy diet, consider regular exercise - CBC with Differential/Platelet - Lipid panel - POCT urinalysis dipstick - cancelled, pt unable to void  2. Gilbert syndrome No recent symptoms or stressors - Comprehensive metabolic panel  3. Prostate cancer screening DRE deferred - PSA   Partially dictated using Dragon software. Any errors are unintentional.  Bari Edward, MD [facility]  07/15/2019

## 2019-07-16 LAB — CBC WITH DIFFERENTIAL/PLATELET
Basophils Absolute: 0 10*3/uL (ref 0.0–0.2)
Basos: 1 %
EOS (ABSOLUTE): 0.2 10*3/uL (ref 0.0–0.4)
Eos: 4 %
Hematocrit: 46.4 % (ref 37.5–51.0)
Hemoglobin: 16.4 g/dL (ref 13.0–17.7)
Immature Grans (Abs): 0 10*3/uL (ref 0.0–0.1)
Immature Granulocytes: 0 %
Lymphocytes Absolute: 1.5 10*3/uL (ref 0.7–3.1)
Lymphs: 27 %
MCH: 34.4 pg — ABNORMAL HIGH (ref 26.6–33.0)
MCHC: 35.3 g/dL (ref 31.5–35.7)
MCV: 97 fL (ref 79–97)
Monocytes Absolute: 0.5 10*3/uL (ref 0.1–0.9)
Monocytes: 9 %
Neutrophils Absolute: 3.3 10*3/uL (ref 1.4–7.0)
Neutrophils: 59 %
Platelets: 275 10*3/uL (ref 150–450)
RBC: 4.77 x10E6/uL (ref 4.14–5.80)
RDW: 12.4 % (ref 11.6–15.4)
WBC: 5.6 10*3/uL (ref 3.4–10.8)

## 2019-07-16 LAB — COMPREHENSIVE METABOLIC PANEL
ALT: 27 IU/L (ref 0–44)
AST: 18 IU/L (ref 0–40)
Albumin/Globulin Ratio: 1.8 (ref 1.2–2.2)
Albumin: 4.5 g/dL (ref 4.0–5.0)
Alkaline Phosphatase: 55 IU/L (ref 39–117)
BUN/Creatinine Ratio: 18 (ref 9–20)
BUN: 19 mg/dL (ref 6–24)
Bilirubin Total: 1.6 mg/dL — ABNORMAL HIGH (ref 0.0–1.2)
CO2: 24 mmol/L (ref 20–29)
Calcium: 9.5 mg/dL (ref 8.7–10.2)
Chloride: 101 mmol/L (ref 96–106)
Creatinine, Ser: 1.07 mg/dL (ref 0.76–1.27)
GFR calc Af Amer: 94 mL/min/{1.73_m2} (ref >59)
GFR calc non Af Amer: 81 mL/min/{1.73_m2} (ref >59)
Globulin, Total: 2.5 g/dL (ref 1.5–4.5)
Glucose: 94 mg/dL (ref 65–99)
Potassium: 4.9 mmol/L (ref 3.5–5.2)
Sodium: 139 mmol/L (ref 134–144)
Total Protein: 7 g/dL (ref 6.0–8.5)

## 2019-07-16 LAB — LIPID PANEL
Chol/HDL Ratio: 2.3 ratio (ref 0.0–5.0)
Cholesterol, Total: 150 mg/dL (ref 100–199)
HDL: 66 mg/dL (ref >39)
LDL Chol Calc (NIH): 73 mg/dL (ref 0–99)
Triglycerides: 52 mg/dL (ref 0–149)
VLDL Cholesterol Cal: 11 mg/dL (ref 5–40)

## 2019-07-16 LAB — PSA: Prostate Specific Ag, Serum: 0.8 ng/mL (ref 0.0–4.0)

## 2019-08-17 DIAGNOSIS — Z20828 Contact with and (suspected) exposure to other viral communicable diseases: Secondary | ICD-10-CM | POA: Diagnosis not present

## 2019-10-13 ENCOUNTER — Ambulatory Visit: Payer: BLUE CROSS/BLUE SHIELD | Attending: Internal Medicine

## 2019-10-13 DIAGNOSIS — Z23 Encounter for immunization: Secondary | ICD-10-CM

## 2019-10-13 NOTE — Progress Notes (Signed)
   Covid-19 Vaccination Clinic  Name:  John Bauer    MRN: [id number] DOB: 10/27/1970  10/13/2019  [name] was observed post Covid-19 immunization for 15 minutes without incident. He was provided with Vaccine Information Sheet and instruction to access the V-Safe system.   [name] was instructed to call 911 with any severe reactions post vaccine: Marland Kitchen Difficulty breathing  . Swelling of face and throat  . A fast heartbeat  . A bad rash all over body  . Dizziness and weakness   Immunizations Administered    Name Date Dose VIS Date Route   Pfizer COVID-19 Vaccine 10/13/2019  6:29 PM 0.3 mL 06/28/2019 Intramuscular   Manufacturer: [employer], [employer]   Lot: [id number]   NDC: [address]-5

## 2019-11-03 ENCOUNTER — Ambulatory Visit: Payer: BLUE CROSS/BLUE SHIELD | Attending: Internal Medicine

## 2019-11-03 DIAGNOSIS — Z23 Encounter for immunization: Secondary | ICD-10-CM

## 2019-11-03 NOTE — Progress Notes (Signed)
   Covid-19 Vaccination Clinic  Name:  John Bauer    MRN: [id number] DOB: [birth year]  11/03/2019  [name] was observed post Covid-19 immunization for 15 minutes without incident. He was provided with Vaccine Information Sheet and instruction to access the V-Safe system.   [name] was instructed to call 911 with any severe reactions post vaccine: Marland Kitchen Difficulty breathing  . Swelling of face and throat  . A fast heartbeat  . A bad rash all over body  . Dizziness and weakness   Immunizations Administered    Name Date Dose VIS Date Route   Pfizer COVID-19 Vaccine 11/03/2019  5:46 PM 0.3 mL 06/28/2019 Intramuscular   Manufacturer: [employer], [employer]   Lot: [id number]   NDC: [address]-1

## 2020-02-07 IMAGING — CT CT RENAL STONE PROTOCOL
2 of 4 series · 16 of 46 positions shown, 18 images · non-contrast
Comparison: 12/23/2016

CLINICAL DATA: Acute right flank pain

EXAM:
CT ABDOMEN AND PELVIS WITHOUT CONTRAST
TECHNIQUE: Multidetector CT imaging of the abdomen and pelvis was performed
following the standard protocol without IV contrast.

[Series 3: stone full standard · axial · 0.75mm/px · z∈[-796,-311]mm · 13 of 107 slices shown, 15 images]
[im 5/107  soft-tissue]
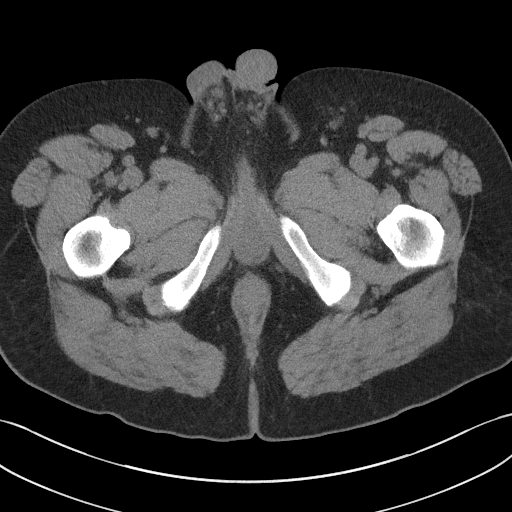
[im 5/107  bone]
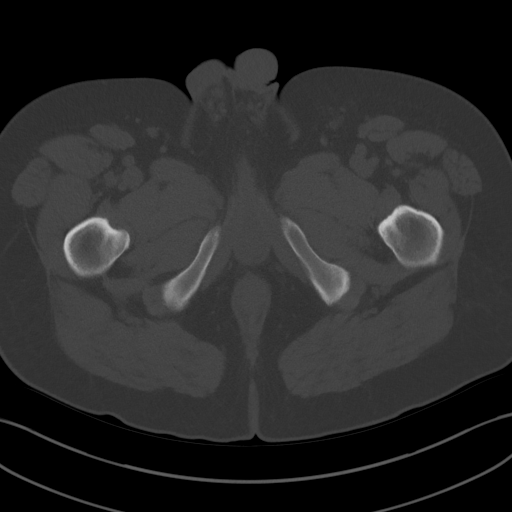
[im 14/107  soft-tissue]
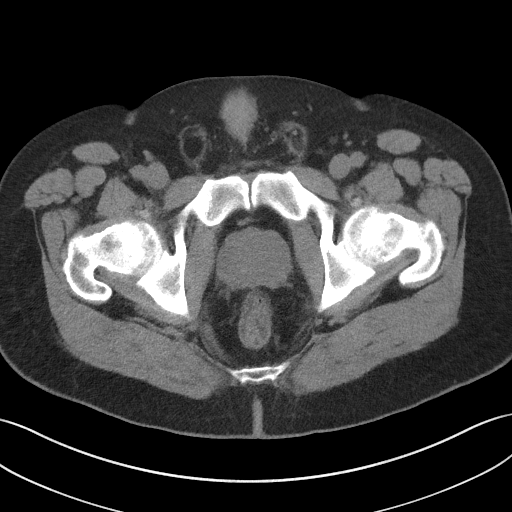
[im 23/107  soft-tissue]
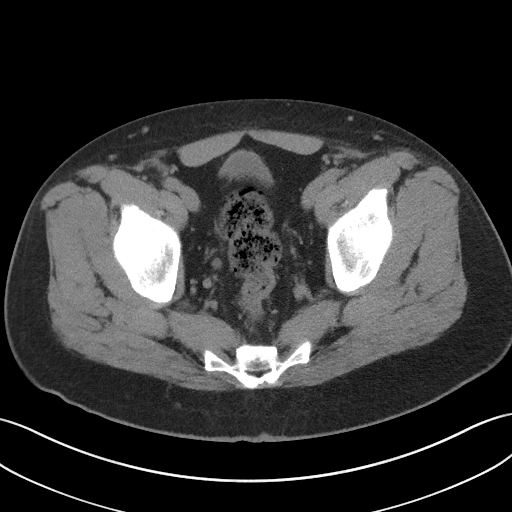
[im 31/107  soft-tissue]
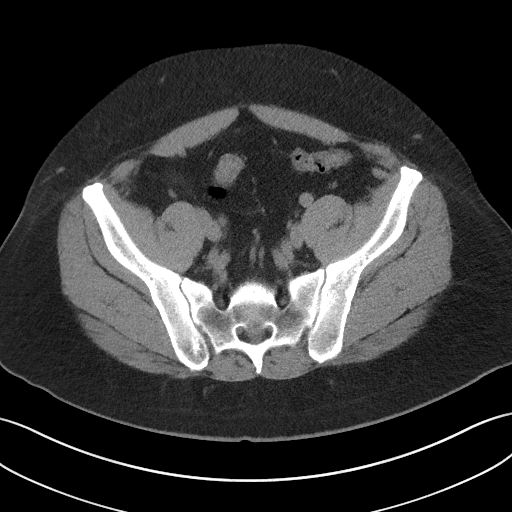
[im 36/107  soft-tissue]
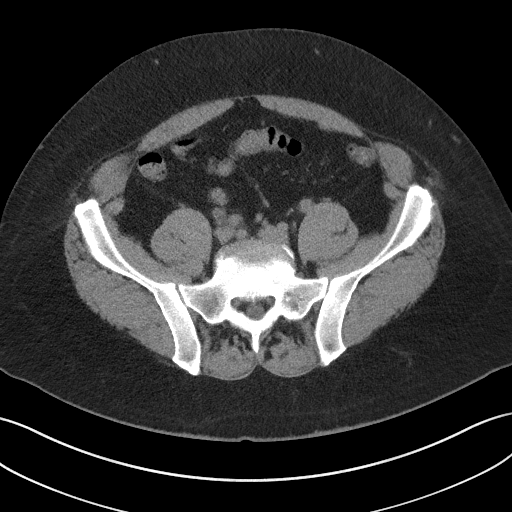
[im 45/107  soft-tissue]
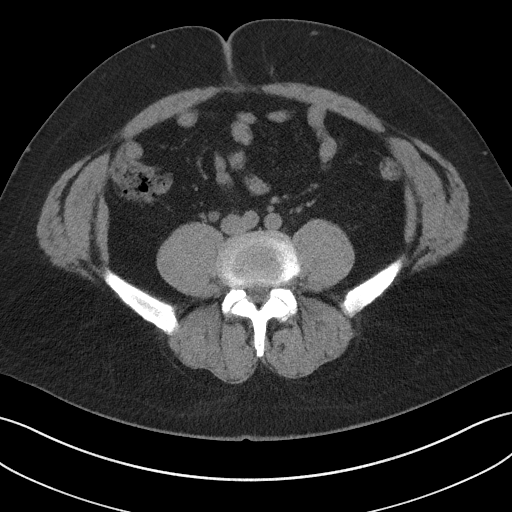
[im 54/107  soft-tissue]
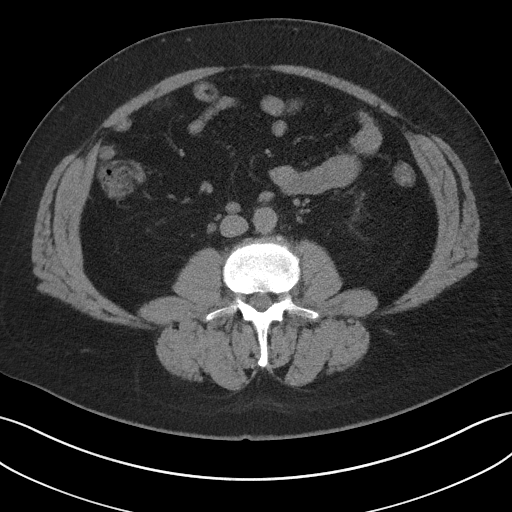
[im 62/107  soft-tissue]
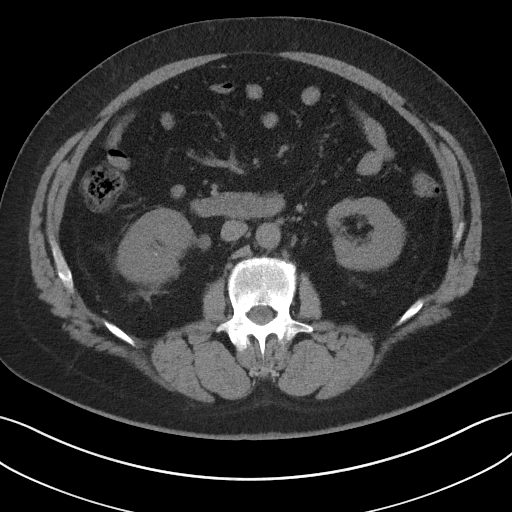
[im 71/107  soft-tissue]
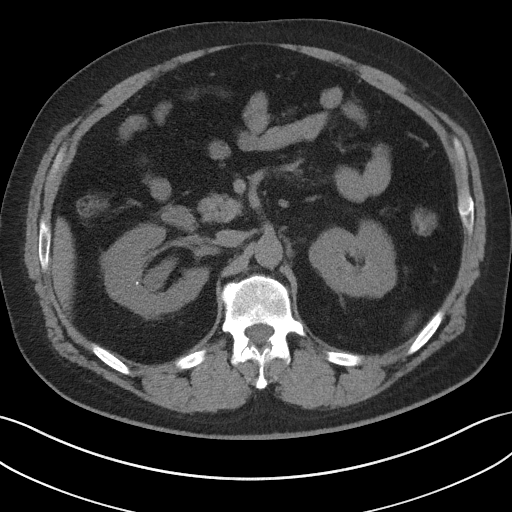
[im 71/107  bone]
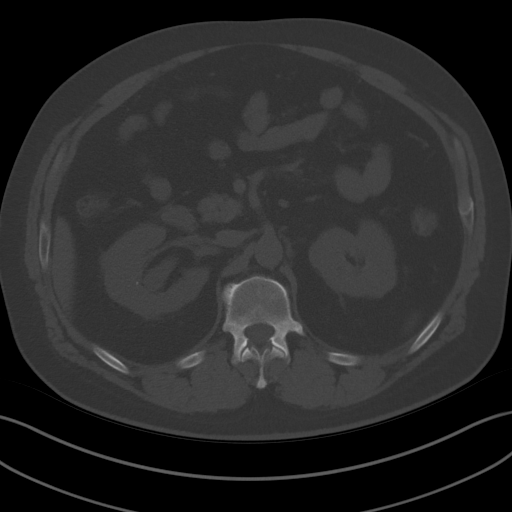
[im 76/107  soft-tissue]
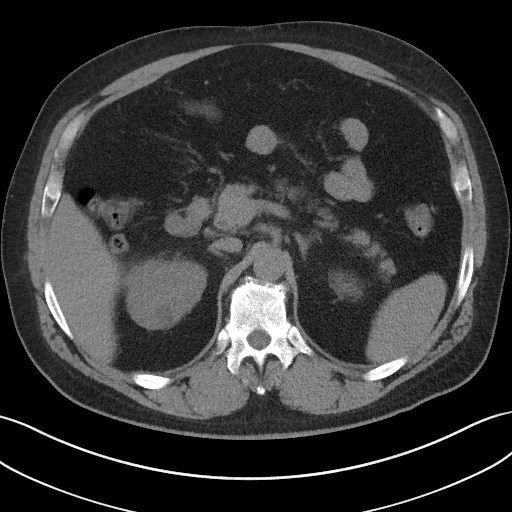
[im 84/107  soft-tissue]
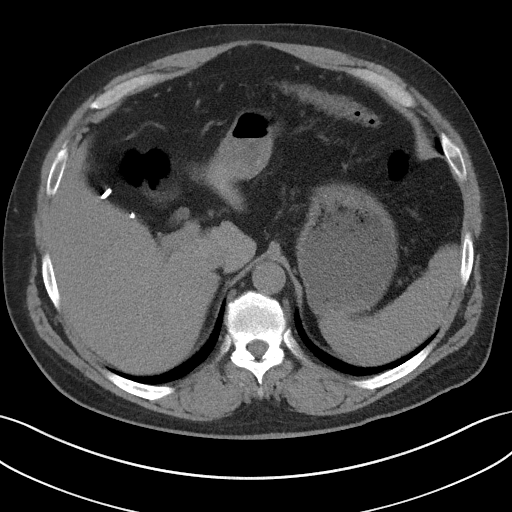
[im 93/107  soft-tissue]
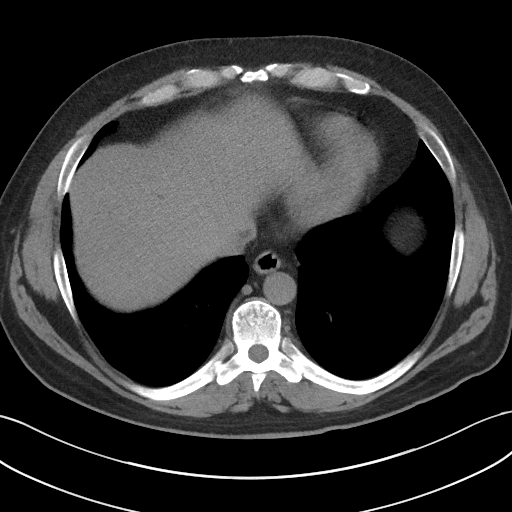
[im 102/107  soft-tissue]
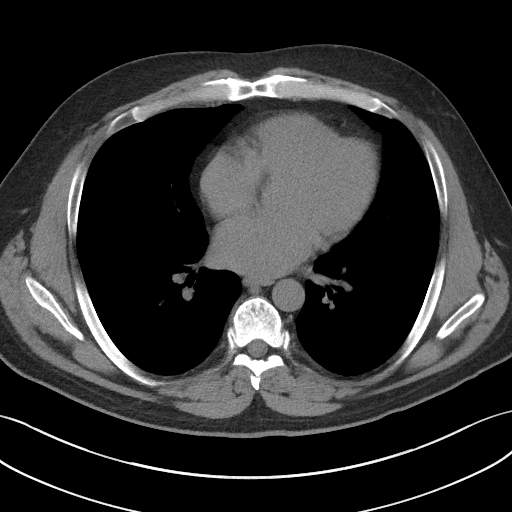

[Series 6: coronal · coronal · 0.78mm/px · 3 of 161 slices shown]
[im 54/161  soft-tissue]
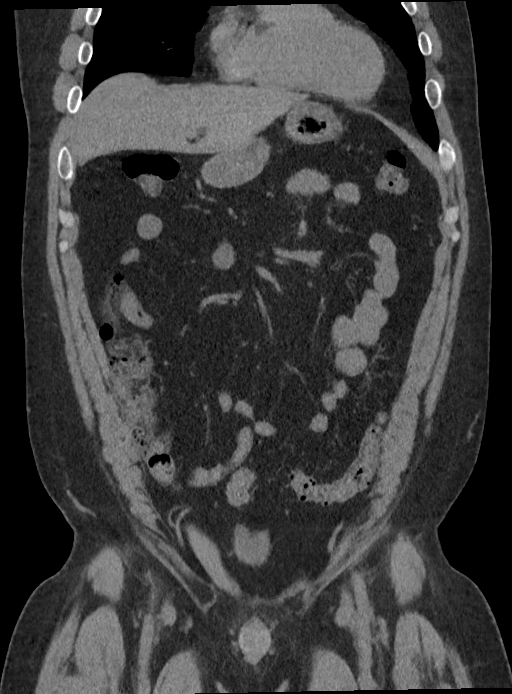
[im 72/161  soft-tissue]
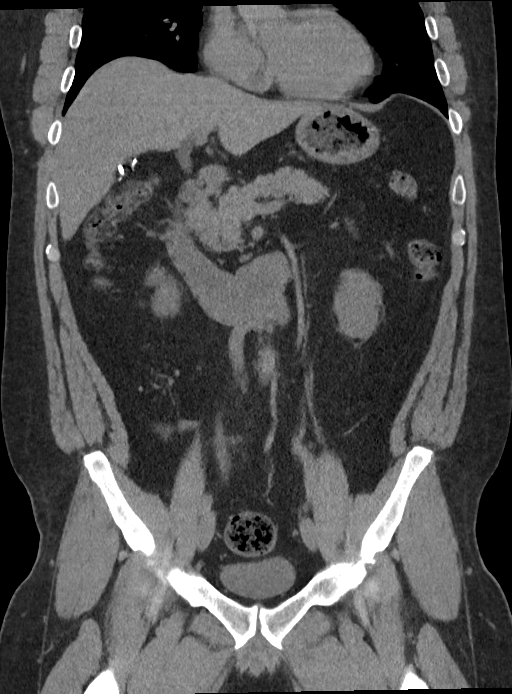
[im 89/161  soft-tissue]
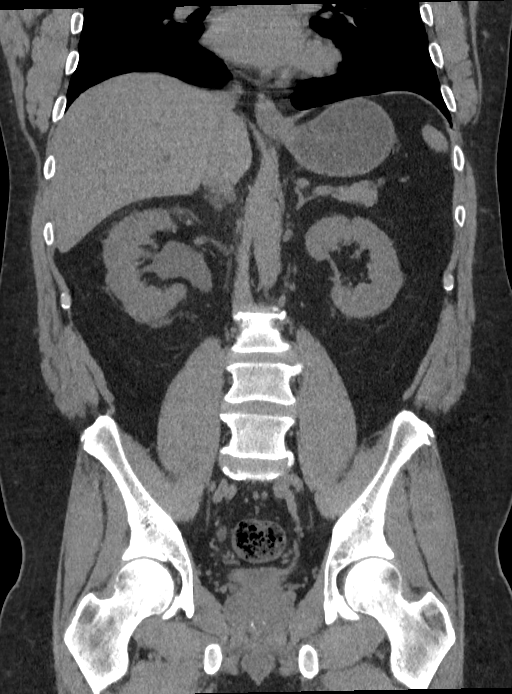

[16 of 46 positions shown; findings below may reference images not displayed]

FINDINGS: Lower chest:  No contributory findings.

Hepatobiliary: No focal liver abnormality.Cholecystectomy with
chronic mild intrahepatic bile duct prominence.

Pancreas: Unremarkable.

Spleen: Unremarkable.

Adrenals/Urinary Tract: Negative adrenals. Right hydronephrosis due
to a 5 x 3 mm stone at the UVJ. A 2 mm stone is seen in the distal
left ureter without hydronephrosis. Three right renal calculi
measuring up to 3 mm at the lower pole. Small right renal upper pole
cystic density, stable. Unremarkable bladder.

Stomach/Bowel:  No obstruction. No appendicitis.

Vascular/Lymphatic: No acute vascular abnormality. No mass or
adenopathy.

Reproductive:Negative.

Other: No ascites or pneumoperitoneum.

Musculoskeletal: No acute abnormalities.  Spondylosis.
IMPRESSION: 1. Right hydronephrosis from a 5 x 3 mm UVJ calculus.
2. Left 2 mm distal ureteral stone without hydronephrosis.
3. Small right renal calculi.

## 2020-07-15 ENCOUNTER — Other Ambulatory Visit: Payer: Self-pay

## 2020-07-15 ENCOUNTER — Encounter: Payer: Self-pay | Admitting: Internal Medicine

## 2020-07-15 ENCOUNTER — Ambulatory Visit (INDEPENDENT_AMBULATORY_CARE_PROVIDER_SITE_OTHER): Payer: BC Managed Care – PPO | Admitting: Internal Medicine

## 2020-07-15 VITALS — BP 132/88 | HR 83 | Ht 70.0 in | Wt 253.0 lb

## 2020-07-15 DIAGNOSIS — Z125 Encounter for screening for malignant neoplasm of prostate: Secondary | ICD-10-CM

## 2020-07-15 DIAGNOSIS — Z0000 Encounter for general adult medical examination without abnormal findings: Secondary | ICD-10-CM | POA: Diagnosis not present

## 2020-07-15 DIAGNOSIS — Z1159 Encounter for screening for other viral diseases: Secondary | ICD-10-CM | POA: Diagnosis not present

## 2020-07-15 DIAGNOSIS — Z1211 Encounter for screening for malignant neoplasm of colon: Secondary | ICD-10-CM | POA: Diagnosis not present

## 2020-07-15 DIAGNOSIS — M7711 Lateral epicondylitis, right elbow: Secondary | ICD-10-CM

## 2020-07-15 NOTE — Progress Notes (Signed)
Date:  07/15/2020   Name:  John Bauer   DOB:  Jan 29, 1971   MRN:  [id number]   Chief Complaint: Annual Exam  John Bauer is a 49 y.o. male who presents today for his Complete Annual Exam. He feels well. He reports exercising - none structured but has a physical job. He reports he is sleeping well.   Colonoscopy: due/pt prefers FIT yearly  Immunization History  Administered Date(s) Administered  . Influenza,inj,Quad PF,6+ Mos 06/05/2019  . Influenza-Unspecified 07/01/2018  . PFIZER SARS-COV-2 Vaccination 10/13/2019, 11/03/2019    Arm Pain  There was no injury mechanism. The pain is present in the right elbow. The quality of the pain is described as aching and shooting. The pain does not radiate. The pain is moderate. The pain has been fluctuating since the incident. Pertinent negatives include no chest pain, muscle weakness or numbness. Exacerbated by: playing golf. He has tried ice, NSAIDs and immobilization for the symptoms. The treatment provided mild relief.    Lab Results  Component Value Date   CREATININE 1.07 07/15/2019   BUN 19 07/15/2019   NA 139 07/15/2019   K 4.9 07/15/2019   CL 101 07/15/2019   CO2 24 07/15/2019   Lab Results  Component Value Date   CHOL 150 07/15/2019   HDL 66 07/15/2019   LDLCALC 73 07/15/2019   TRIG 52 07/15/2019   CHOLHDL 2.3 07/15/2019   Lab Results  Component Value Date   TSH 1.610 07/06/2018   No results found for: HGBA1C Lab Results  Component Value Date   WBC 5.6 07/15/2019   HGB 16.4 07/15/2019   HCT 46.4 07/15/2019   MCV 97 07/15/2019   PLT 275 07/15/2019   Lab Results  Component Value Date   ALT 27 07/15/2019   AST 18 07/15/2019   ALKPHOS 55 07/15/2019   BILITOT 1.6 (H) 07/15/2019     Review of Systems  Constitutional: Negative for appetite change, chills, diaphoresis, fatigue and unexpected weight change.  HENT: Negative for hearing loss, tinnitus, trouble swallowing and voice change.   Eyes:  Negative for visual disturbance.  Respiratory: Negative for choking, shortness of breath and wheezing.   Cardiovascular: Negative for chest pain, palpitations and leg swelling.  Gastrointestinal: Negative for abdominal pain, blood in stool, constipation and diarrhea.  Genitourinary: Negative for difficulty urinating, dysuria, frequency, hematuria and urgency.  Musculoskeletal: Negative for arthralgias (right elbow pain), back pain and myalgias.  Skin: Negative for color change and rash.  Neurological: Negative for dizziness, focal weakness, syncope, numbness and headaches.  Hematological: Negative for adenopathy.  Psychiatric/Behavioral: Negative for dysphoric mood and sleep disturbance. The patient is not nervous/anxious.     Patient Active Problem List   Diagnosis Date Noted  . Recurrent sinus infections 07/06/2018  . Gilbert syndrome 07/06/2018  . History of smoking 02/13/2018  . Kidney stones 02/13/2018  . Chronic fatigue 02/13/2018    No Known Allergies  Past Surgical History:  Procedure Laterality Date  . CHOLECYSTECTOMY  age 28's  . CYSTOSCOPY W/ RETROGRADES Bilateral 08/28/2018   Procedure: CYSTOSCOPY WITH RETROGRADE PYELOGRAM;  Surgeon: Orson Ape, MD;  Location: [facility];  Service: Urology;  Laterality: Bilateral;  . EXTRACORPOREAL SHOCK WAVE LITHOTRIPSY Right 01/26/2017   Procedure: EXTRACORPOREAL SHOCK WAVE LITHOTRIPSY (ESWL);  Surgeon: Orson Ape, MD;  Location: [facility];  Service: Urology;  Laterality: Right;  . EXTRACORPOREAL SHOCK WAVE LITHOTRIPSY Left 11/09/2017   Procedure: EXTRACORPOREAL SHOCK WAVE LITHOTRIPSY (ESWL);  Surgeon: Anola Gurney  R, MD;  Location: [facility];  Service: Urology;  Laterality: Left;  . FOOT SURGERY Right 1995  . HOLMIUM LASER APPLICATION Right 01/03/2017   Procedure: HOLMIUM LASER APPLICATION;  Surgeon: Orson Ape, MD;  Location: [facility];  Service: Urology;  Laterality: Right;  . URETERAL STENT PLACEMENT  12/2016  .  URETEROLITHOTOMY  2008  . URETEROSCOPY WITH HOLMIUM LASER LITHOTRIPSY Right 01/03/2017   Procedure: URETEROSCOPY WITH HOLMIUM LASER LITHOTRIPSY;  Surgeon: Orson Ape, MD;  Location: [facility];  Service: Urology;  Laterality: Right;  . URETEROSCOPY WITH HOLMIUM LASER LITHOTRIPSY Bilateral 08/28/2018   Procedure: URETEROSCOPY WITH HOLMIUM LASER LITHOTRIPSY;  Surgeon: Orson Ape, MD;  Location: [facility];  Service: Urology;  Laterality: Bilateral;    Social History   Tobacco Use  . Smoking status: Former Smoker    Packs/day: 1.00    Years: 5.00    Pack years: 5.00    Types: Cigarettes    Quit date: 12/20/1993    Years since quitting: 26.5  . Smokeless tobacco: Current User    Types: Snuff  Vaping Use  . Vaping Use: Never used  Substance Use Topics  . Alcohol use: Yes    Alcohol/week: 4.0 standard drinks    Types: 4 Standard drinks or equivalent per week    Comment: occasionally  . Drug use: Not Currently     Medication list has been reviewed and updated.  Current Meds  Medication Sig  . fluticasone (FLONASE) 50 MCG/ACT nasal spray Place into both nostrils daily.    PHQ 2/9 Scores 07/15/2020 07/15/2019 07/06/2018 02/12/2018  PHQ - 2 Score 0 0 0 0  PHQ- 9 Score 0 0 - -    GAD 7 : Generalized Anxiety Score 07/15/2020  Nervous, Anxious, on Edge 0  Control/stop worrying 0  Worry too much - different things 0  Trouble relaxing 0  Restless 0  Easily annoyed or irritable 0  Afraid - awful might happen 0  Total GAD 7 Score 0  Anxiety Difficulty Not difficult at all    BP Readings from Last 3 Encounters:  07/15/20 132/88  07/15/19 126/84  08/29/18 (!) 142/86    Physical Exam Vitals and nursing note reviewed.  Constitutional:      Appearance: Normal appearance. He is well-developed.  HENT:     Head: Normocephalic.     Right Ear: Tympanic membrane, ear canal and external ear normal.     Left Ear: Tympanic membrane, ear canal and external ear normal.     Nose:  Nose normal.  Eyes:     Conjunctiva/sclera: Conjunctivae normal.     Pupils: Pupils are equal, round, and reactive to light.  Neck:     Thyroid: No thyromegaly.     Vascular: No carotid bruit.  Cardiovascular:     Rate and Rhythm: Normal rate and regular rhythm.     Pulses: Normal pulses.     Heart sounds: Normal heart sounds.  Pulmonary:     Effort: Pulmonary effort is normal.     Breath sounds: Normal breath sounds. No wheezing.  Chest:  Breasts:     Right: No mass.     Left: No mass.    Abdominal:     General: Bowel sounds are normal.     Palpations: Abdomen is soft.     Tenderness: There is no abdominal tenderness.  Musculoskeletal:        General: Normal range of motion.     Right elbow: No swelling or  deformity. Tenderness (over lateral epicondyle) present.     Left elbow: Normal.     Cervical back: Normal range of motion and neck supple.     Right lower leg: No edema.     Left lower leg: No edema.  Lymphadenopathy:     Cervical: No cervical adenopathy.  Skin:    General: Skin is warm and dry.  Neurological:     General: No focal deficit present.     Mental Status: He is alert and oriented to person, place, and time.     Motor: Motor function is intact.     Gait: Gait is intact.     Deep Tendon Reflexes: Reflexes are normal and symmetric.  Psychiatric:        Attention and Perception: Attention normal.        Mood and Affect: Mood normal.        Behavior: Behavior normal.     Wt Readings from Last 3 Encounters:  07/15/20 253 lb (114.8 kg)  07/15/19 257 lb 6.4 oz (116.8 kg)  08/29/18 245 lb (111.1 kg)    BP 132/88   Pulse 83   Ht 5\' 10"  (1.778 m)   Wt 253 lb (114.8 kg)   SpO2 96%   BMI 36.30 kg/m   Assessment and Plan: 1. Annual physical exam Exam is normal except for weight. Encourage regular exercise and appropriate dietary changes. Monitor BP at home intermittently - CBC with Differential/Platelet - Comprehensive metabolic panel - Lipid  panel  2. Colon cancer screening Patient hesitant to have colonoscopy - no family hx of CRC - Fecal occult blood, imunochemical  3. Prostate cancer screening DRE deferred - PSA  4. Need for hepatitis C screening test - Hepatitis C antibody  5. Lateral epicondylitis of right elbow Recommend tennis elbow strap when playing golf Ice after exertion May need steroid taper or Ortho referral   Partially dictated using . Any errors are unintentional.  Animal nutritionist, MD [facility]  07/15/2020

## 2020-07-16 LAB — CBC WITH DIFFERENTIAL/PLATELET
Basophils Absolute: 0 10*3/uL (ref 0.0–0.2)
Basos: 1 %
EOS (ABSOLUTE): 0.2 10*3/uL (ref 0.0–0.4)
Eos: 3 %
Hematocrit: 48.4 % (ref 37.5–51.0)
Hemoglobin: 16.7 g/dL (ref 13.0–17.7)
Immature Grans (Abs): 0 10*3/uL (ref 0.0–0.1)
Immature Granulocytes: 0 %
Lymphocytes Absolute: 2 10*3/uL (ref 0.7–3.1)
Lymphs: 39 %
MCH: 33.3 pg — ABNORMAL HIGH (ref 26.6–33.0)
MCHC: 34.5 g/dL (ref 31.5–35.7)
MCV: 97 fL (ref 79–97)
Monocytes Absolute: 0.5 10*3/uL (ref 0.1–0.9)
Monocytes: 10 %
Neutrophils Absolute: 2.4 10*3/uL (ref 1.4–7.0)
Neutrophils: 47 %
Platelets: 310 10*3/uL (ref 150–450)
RBC: 5.01 x10E6/uL (ref 4.14–5.80)
RDW: 12.4 % (ref 11.6–15.4)
WBC: 5.2 10*3/uL (ref 3.4–10.8)

## 2020-07-16 LAB — COMPREHENSIVE METABOLIC PANEL
ALT: 39 IU/L (ref 0–44)
AST: 23 IU/L (ref 0–40)
Albumin/Globulin Ratio: 2.1 (ref 1.2–2.2)
Albumin: 4.6 g/dL (ref 4.0–5.0)
Alkaline Phosphatase: 55 IU/L (ref 44–121)
BUN/Creatinine Ratio: 19 (ref 9–20)
BUN: 20 mg/dL (ref 6–24)
Bilirubin Total: 1.7 mg/dL — ABNORMAL HIGH (ref 0.0–1.2)
CO2: 23 mmol/L (ref 20–29)
Calcium: 9.6 mg/dL (ref 8.7–10.2)
Chloride: 102 mmol/L (ref 96–106)
Creatinine, Ser: 1.03 mg/dL (ref 0.76–1.27)
GFR calc Af Amer: 97 mL/min/{1.73_m2} (ref >59)
GFR calc non Af Amer: 84 mL/min/{1.73_m2} (ref >59)
Globulin, Total: 2.2 g/dL (ref 1.5–4.5)
Glucose: 95 mg/dL (ref 65–99)
Potassium: 4.8 mmol/L (ref 3.5–5.2)
Sodium: 140 mmol/L (ref 134–144)
Total Protein: 6.8 g/dL (ref 6.0–8.5)

## 2020-07-16 LAB — LIPID PANEL
Chol/HDL Ratio: 2.7 ratio (ref 0.0–5.0)
Cholesterol, Total: 175 mg/dL (ref 100–199)
HDL: 66 mg/dL (ref >39)
LDL Chol Calc (NIH): 96 mg/dL (ref 0–99)
Triglycerides: 68 mg/dL (ref 0–149)
VLDL Cholesterol Cal: 13 mg/dL (ref 5–40)

## 2020-07-16 LAB — PSA: Prostate Specific Ag, Serum: 0.8 ng/mL (ref 0.0–4.0)

## 2020-07-16 LAB — HEPATITIS C ANTIBODY: Hep C Virus Ab: <0.1 s/co ratio (ref 0.0–0.9)

## 2020-08-06 NOTE — Progress Notes (Signed)
Called pt left VM with a reminder to complete and mail in fit test. Pts name was stated on VM.  KP

## 2020-09-10 ENCOUNTER — Telehealth: Payer: Self-pay

## 2020-09-10 NOTE — Telephone Encounter (Signed)
Called and left VM for patient to remind him to complete his fecal occult test given at his last appt. Told him to call back with any questions or concerns or call us if he needs another testing kit to pick up.

## 2021-07-21 ENCOUNTER — Encounter: Payer: BC Managed Care – PPO | Admitting: Internal Medicine

## 2021-08-25 ENCOUNTER — Ambulatory Visit (INDEPENDENT_AMBULATORY_CARE_PROVIDER_SITE_OTHER): Payer: BC Managed Care – PPO | Admitting: Internal Medicine

## 2021-08-25 ENCOUNTER — Encounter: Payer: Self-pay | Admitting: Internal Medicine

## 2021-08-25 ENCOUNTER — Other Ambulatory Visit: Payer: Self-pay

## 2021-08-25 VITALS — BP 130/78 | HR 82 | Ht 70.0 in | Wt 219.0 lb

## 2021-08-25 DIAGNOSIS — Z1211 Encounter for screening for malignant neoplasm of colon: Secondary | ICD-10-CM

## 2021-08-25 DIAGNOSIS — Z0000 Encounter for general adult medical examination without abnormal findings: Secondary | ICD-10-CM | POA: Diagnosis not present

## 2021-08-25 DIAGNOSIS — Z125 Encounter for screening for malignant neoplasm of prostate: Secondary | ICD-10-CM | POA: Diagnosis not present

## 2021-08-25 NOTE — Progress Notes (Signed)
Date:  08/25/2021   Name:  John Bauer   DOB:  [birth year]   MRN:  [id number]   Chief Complaint: Annual Exam John Bauer is a 51 y.o. male who presents today for his Complete Annual Exam. He feels well. He reports exercising - none. He reports he is sleeping fairly well.   Colonoscopy: none FIT given last year  Immunization History  Administered Date(s) Administered   Influenza,inj,Quad PF,6+ Mos 06/05/2019   Influenza-Unspecified 07/01/2018   PFIZER(Purple Top)SARS-COV-2 Vaccination 10/13/2019, 11/03/2019    Lab Results  Component Value Date   PSA1 0.8 07/15/2020   PSA1 0.8 07/15/2019   PSA1 0.9 07/06/2018    HPI  Lab Results  Component Value Date   NA 140 07/15/2020   K 4.8 07/15/2020   CO2 23 07/15/2020   GLUCOSE 95 07/15/2020   BUN 20 07/15/2020   CREATININE 1.03 07/15/2020   CALCIUM 9.6 07/15/2020   GFRNONAA 84 07/15/2020   Lab Results  Component Value Date   CHOL 175 07/15/2020   HDL 66 07/15/2020   LDLCALC 96 07/15/2020   TRIG 68 07/15/2020   CHOLHDL 2.7 07/15/2020   Lab Results  Component Value Date   TSH 1.610 07/06/2018   No results found for: HGBA1C Lab Results  Component Value Date   WBC 5.2 07/15/2020   HGB 16.7 07/15/2020   HCT 48.4 07/15/2020   MCV 97 07/15/2020   PLT 310 07/15/2020   Lab Results  Component Value Date   ALT 39 07/15/2020   AST 23 07/15/2020   ALKPHOS 55 07/15/2020   BILITOT 1.7 (H) 07/15/2020   No results found for: 25OHVITD2, 25OHVITD3, VD25OH   Review of Systems  Constitutional:  Negative for appetite change, chills, diaphoresis, fatigue and unexpected weight change.  HENT:  Negative for hearing loss, tinnitus, trouble swallowing and voice change.   Eyes:  Negative for visual disturbance.  Respiratory:  Negative for choking, shortness of breath and wheezing.   Cardiovascular:  Negative for chest pain, palpitations and leg swelling.  Gastrointestinal:  Negative for abdominal pain, blood in stool,  constipation and diarrhea.  Genitourinary:  Negative for difficulty urinating, dysuria and frequency.  Musculoskeletal:  Negative for arthralgias, back pain and myalgias.  Skin:  Negative for color change and rash.  Neurological:  Negative for dizziness, syncope and headaches.  Hematological:  Negative for adenopathy.  Psychiatric/Behavioral:  Negative for dysphoric mood and sleep disturbance. The patient is not nervous/anxious.    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   History of smoking 02/13/2018   Kidney stones 02/13/2018   Chronic fatigue 02/13/2018    No Known Allergies  Past Surgical History:  Procedure Laterality Date   CHOLECYSTECTOMY  age 34's   CYSTOSCOPY W/ RETROGRADES Bilateral 08/28/2018   Procedure: CYSTOSCOPY WITH RETROGRADE PYELOGRAM;  Surgeon: Royston Cowper, MD;  Location: [facility];  Service: Urology;  Laterality: Bilateral;   EXTRACORPOREAL SHOCK WAVE LITHOTRIPSY Right 01/26/2017   Procedure: EXTRACORPOREAL SHOCK WAVE LITHOTRIPSY (ESWL);  Surgeon: Royston Cowper, MD;  Location: [facility];  Service: Urology;  Laterality: Right;   EXTRACORPOREAL SHOCK WAVE LITHOTRIPSY Left 11/09/2017   Procedure: EXTRACORPOREAL SHOCK WAVE LITHOTRIPSY (ESWL);  Surgeon: Royston Cowper, MD;  Location: [facility];  Service: Urology;  Laterality: Left;   FOOT SURGERY Right 1995   HOLMIUM LASER APPLICATION Right [id number]  Procedure: HOLMIUM LASER APPLICATION;  Surgeon: Royston Cowper, MD;  Location: [facility];  Service: Urology;  Laterality: Right;   URETERAL STENT PLACEMENT  12/2016   URETEROLITHOTOMY  2008   URETEROSCOPY WITH HOLMIUM LASER LITHOTRIPSY Right 01/03/2017   Procedure: URETEROSCOPY WITH HOLMIUM LASER LITHOTRIPSY;  Surgeon: Royston Cowper, MD;  Location: [facility];  Service: Urology;  Laterality: Right;   URETEROSCOPY WITH HOLMIUM LASER  LITHOTRIPSY Bilateral 08/28/2018   Procedure: URETEROSCOPY WITH HOLMIUM LASER LITHOTRIPSY;  Surgeon: Royston Cowper, MD;  Location: [facility];  Service: Urology;  Laterality: Bilateral;    Social History   Tobacco Use   Smoking status: Former    Packs/day: 1.00    Years: 5.00    Pack years: 5.00    Types: Cigarettes    Quit date: 12/20/1993    Years since quitting: 27.6   Smokeless tobacco: Current    Types: Snuff  Vaping Use   Vaping Use: Never used  Substance Use Topics   Alcohol use: Yes    Alcohol/week: 4.0 standard drinks    Types: 4 Standard drinks or equivalent per week    Comment: occasionally   Drug use: Not Currently     Medication list has been reviewed and updated.  No outpatient medications have been marked as taking for the 08/25/21 encounter (Office Visit) with Glean Hess, MD.    [facility] 2/9 Scores 08/25/2021 07/15/2020 07/15/2019 07/06/2018  PHQ - 2 Score 0 0 0 0  PHQ- 9 Score 0 0 0 -    GAD 7 : Generalized Anxiety Score 08/25/2021 07/15/2020  Nervous, Anxious, on Edge 0 0  Control/stop worrying 0 0  Worry too much - different things 0 0  Trouble relaxing 0 0  Restless 0 0  Easily annoyed or irritable 0 0  Afraid - awful might happen 0 0  Total GAD 7 Score 0 0  Anxiety Difficulty Not difficult at all Not difficult at all    BP Readings from Last 3 Encounters:  08/25/21 130/78  07/15/20 132/88  07/15/19 126/84    Physical Exam Vitals and nursing note reviewed.  Constitutional:      Appearance: Normal appearance. He is well-developed.  HENT:     Head: Normocephalic.     Right Ear: Tympanic membrane, ear canal and external ear normal.     Left Ear: Tympanic membrane, ear canal and external ear normal.     Nose: Nose normal.  Eyes:     Conjunctiva/sclera: Conjunctivae normal.     Pupils: Pupils are equal, round, and reactive to light.  Neck:     Thyroid: No thyromegaly.     Vascular: No carotid bruit.  Cardiovascular:     Rate and Rhythm:  Normal rate and regular rhythm.     Heart sounds: Normal heart sounds.  Pulmonary:     Effort: Pulmonary effort is normal.     Breath sounds: Normal breath sounds. No wheezing.  Chest:  Breasts:    Right: No mass.     Left: No mass.  Abdominal:     General: Bowel sounds are normal.     Palpations: Abdomen is soft.     Tenderness: There is no abdominal tenderness.  Musculoskeletal:        General: Normal range of motion.     Cervical back: Normal range of motion and neck supple.  Lymphadenopathy:     Cervical: No cervical adenopathy.  Skin:    General: Skin is warm and dry.  Neurological:     Mental Status: He is alert and oriented to person, place, and time.     Deep Tendon Reflexes: Reflexes are normal and symmetric.  Psychiatric:        Attention and Perception: Attention normal.        Mood and Affect: Mood normal.        Thought Content: Thought content normal.    Wt Readings from Last 3 Encounters:  08/25/21 219 lb (99.3 kg)  07/15/20 253 lb (114.8 kg)  07/15/19 257 lb 6.4 oz (116.8 kg)    BP 130/78    Pulse 82    Ht 5\' 10"  (1.778 m)    Wt 219 lb (99.3 kg)    SpO2 98%    BMI 31.42 kg/m   Assessment and Plan: 1. Annual physical exam Normal exam; doing well with diet changes and weight loss. Continue healthy diet. He declines all vaccinations. - CBC with Differential/Platelet - Comprehensive metabolic panel - Lipid panel  2. Colon cancer screening - Fecal occult blood, imunochemical  3. Prostate cancer screening DRE deferred - PSA   Partially dictated using Dragon software. Any errors are unintentional.  Halina Maidens, MD [facility]  08/25/2021

## 2021-08-26 LAB — CBC WITH DIFFERENTIAL/PLATELET
Basophils Absolute: 0.1 10*3/uL (ref 0.0–0.2)
Basos: 1 %
EOS (ABSOLUTE): 0.1 10*3/uL (ref 0.0–0.4)
Eos: 2 %
Hematocrit: 45.1 % (ref 37.5–51.0)
Hemoglobin: 16.1 g/dL (ref 13.0–17.7)
Immature Grans (Abs): 0 10*3/uL (ref 0.0–0.1)
Immature Granulocytes: 0 %
Lymphocytes Absolute: 1.7 10*3/uL (ref 0.7–3.1)
Lymphs: 29 %
MCH: 33.2 pg — ABNORMAL HIGH (ref 26.6–33.0)
MCHC: 35.7 g/dL (ref 31.5–35.7)
MCV: 93 fL (ref 79–97)
Monocytes Absolute: 0.5 10*3/uL (ref 0.1–0.9)
Monocytes: 9 %
Neutrophils Absolute: 3.5 10*3/uL (ref 1.4–7.0)
Neutrophils: 59 %
Platelets: 335 10*3/uL (ref 150–450)
RBC: 4.85 x10E6/uL (ref 4.14–5.80)
RDW: 11.9 % (ref 11.6–15.4)
WBC: 5.9 10*3/uL (ref 3.4–10.8)

## 2021-08-26 LAB — LIPID PANEL
Chol/HDL Ratio: 2.8 ratio (ref 0.0–5.0)
Cholesterol, Total: 148 mg/dL (ref 100–199)
HDL: 53 mg/dL (ref >39)
LDL Chol Calc (NIH): 85 mg/dL (ref 0–99)
Triglycerides: 43 mg/dL (ref 0–149)
VLDL Cholesterol Cal: 10 mg/dL (ref 5–40)

## 2021-08-26 LAB — COMPREHENSIVE METABOLIC PANEL
ALT: 29 IU/L (ref 0–44)
AST: 18 IU/L (ref 0–40)
Albumin/Globulin Ratio: 1.8 (ref 1.2–2.2)
Albumin: 4.4 g/dL (ref 3.8–4.9)
Alkaline Phosphatase: 63 IU/L (ref 44–121)
BUN/Creatinine Ratio: 19 (ref 9–20)
BUN: 21 mg/dL (ref 6–24)
Bilirubin Total: 1.1 mg/dL (ref 0.0–1.2)
CO2: 25 mmol/L (ref 20–29)
Calcium: 9.4 mg/dL (ref 8.7–10.2)
Chloride: 103 mmol/L (ref 96–106)
Creatinine, Ser: 1.11 mg/dL (ref 0.76–1.27)
Globulin, Total: 2.5 g/dL (ref 1.5–4.5)
Glucose: 106 mg/dL — ABNORMAL HIGH (ref 70–99)
Potassium: 4.9 mmol/L (ref 3.5–5.2)
Sodium: 140 mmol/L (ref 134–144)
Total Protein: 6.9 g/dL (ref 6.0–8.5)
eGFR: 80 mL/min/{1.73_m2} (ref >59)

## 2021-08-26 LAB — PSA: Prostate Specific Ag, Serum: 0.9 ng/mL (ref 0.0–4.0)

## 2021-09-06 DIAGNOSIS — Z1211 Encounter for screening for malignant neoplasm of colon: Secondary | ICD-10-CM | POA: Diagnosis not present

## 2021-09-08 LAB — FECAL OCCULT BLOOD, IMMUNOCHEMICAL: Fecal Occult Bld: NEGATIVE

## 2022-03-17 DIAGNOSIS — D485 Neoplasm of uncertain behavior of skin: Secondary | ICD-10-CM | POA: Diagnosis not present

## 2022-03-17 DIAGNOSIS — L578 Other skin changes due to chronic exposure to nonionizing radiation: Secondary | ICD-10-CM | POA: Diagnosis not present

## 2022-03-17 DIAGNOSIS — L570 Actinic keratosis: Secondary | ICD-10-CM | POA: Diagnosis not present

## 2022-03-17 DIAGNOSIS — D225 Melanocytic nevi of trunk: Secondary | ICD-10-CM | POA: Diagnosis not present

## 2022-08-29 ENCOUNTER — Ambulatory Visit (INDEPENDENT_AMBULATORY_CARE_PROVIDER_SITE_OTHER): Payer: BC Managed Care – PPO | Admitting: Internal Medicine

## 2022-08-29 ENCOUNTER — Encounter: Payer: Self-pay | Admitting: Internal Medicine

## 2022-08-29 VITALS — BP 120/78 | HR 74 | Ht 70.0 in | Wt 229.0 lb

## 2022-08-29 DIAGNOSIS — Z0000 Encounter for general adult medical examination without abnormal findings: Secondary | ICD-10-CM

## 2022-08-29 DIAGNOSIS — Z13220 Encounter for screening for lipoid disorders: Secondary | ICD-10-CM

## 2022-08-29 DIAGNOSIS — Z1211 Encounter for screening for malignant neoplasm of colon: Secondary | ICD-10-CM

## 2022-08-29 DIAGNOSIS — N200 Calculus of kidney: Secondary | ICD-10-CM

## 2022-08-29 DIAGNOSIS — Z125 Encounter for screening for malignant neoplasm of prostate: Secondary | ICD-10-CM

## 2022-08-29 DIAGNOSIS — Z131 Encounter for screening for diabetes mellitus: Secondary | ICD-10-CM

## 2022-08-29 NOTE — Assessment & Plan Note (Signed)
History of recurrent stones Taking over the counter supplement for prevention

## 2022-08-29 NOTE — Progress Notes (Signed)
Date:  08/29/2022   Name:  John Bauer   DOB:  1971/07/09   MRN:  [id number]   Chief Complaint: No chief complaint on file. John Bauer is a 52 y.o. male who presents today for his Complete Annual Exam. He feels well. He reports exercising when playing golf. He reports he is sleeping fairly well.   Colonoscopy: FIT 08/2021 neg  Immunization History  Administered Date(s) Administered   Influenza,inj,Quad PF,6+ Mos 06/05/2019   Influenza-Unspecified 07/01/2018   PFIZER(Purple Top)SARS-COV-2 Vaccination 10/13/2019, 11/03/2019   Health Maintenance Due  Topic Date Due   DTaP/Tdap/Td (1 - Tdap) Never done   COLONOSCOPY (Pts 45-51yr Insurance coverage will need to be confirmed)  Never done    Lab Results  Component Value Date   PSA1 0.9 08/25/2021   PSA1 0.8 07/15/2020   PSA1 0.8 07/15/2019    HPI  Lab Results  Component Value Date   NA 140 08/25/2021   K 4.9 08/25/2021   CO2 25 08/25/2021   GLUCOSE 106 (H) 08/25/2021   BUN 21 08/25/2021   CREATININE 1.11 08/25/2021   CALCIUM 9.4 08/25/2021   EGFR 80 08/25/2021   GFRNONAA 84 07/15/2020   Lab Results  Component Value Date   CHOL 148 08/25/2021   HDL 53 08/25/2021   LDLCALC 85 08/25/2021   TRIG 43 08/25/2021   CHOLHDL 2.8 08/25/2021   Lab Results  Component Value Date   TSH 1.610 07/06/2018   No results found for: "HGBA1C" Lab Results  Component Value Date   WBC 5.9 08/25/2021   HGB 16.1 08/25/2021   HCT 45.1 08/25/2021   MCV 93 08/25/2021   PLT 335 08/25/2021   Lab Results  Component Value Date   ALT 29 08/25/2021   AST 18 08/25/2021   ALKPHOS 63 08/25/2021   BILITOT 1.1 08/25/2021   No results found for: "25OHVITD2", "25OHVITD3", "VD25OH"   Review of Systems  Constitutional:  Negative for appetite change, chills, diaphoresis, fatigue and unexpected weight change.  HENT:  Negative for hearing loss, tinnitus, trouble swallowing and voice change.   Eyes:  Negative for visual  disturbance.  Respiratory:  Negative for choking, shortness of breath and wheezing.   Cardiovascular:  Negative for chest pain, palpitations and leg swelling.  Gastrointestinal:  Negative for abdominal pain, blood in stool, constipation and diarrhea.  Genitourinary:  Negative for difficulty urinating, dysuria and frequency.  Musculoskeletal:  Negative for arthralgias, back pain and myalgias.  Skin:  Negative for color change and rash.  Neurological:  Negative for dizziness, syncope and headaches.  Hematological:  Negative for adenopathy.  Psychiatric/Behavioral:  Negative for dysphoric mood and sleep disturbance. The patient is not nervous/anxious.     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   History of smoking 02/13/2018   Kidney stones 02/13/2018   Chronic fatigue 02/13/2018    No Known Allergies  Past Surgical History:  Procedure Laterality Date   CHOLECYSTECTOMY  age 52's  CYSTOSCOPY W/ RETROGRADES Bilateral 08/28/2018   Procedure: CYSTOSCOPY WITH RETROGRADE PYELOGRAM;  Surgeon: WRoyston Cowper MD;  Location: [facility];  Service: Urology;  Laterality: Bilateral;   EXTRACORPOREAL SHOCK WAVE LITHOTRIPSY Right 01/26/2017   Procedure: EXTRACORPOREAL SHOCK WAVE LITHOTRIPSY (ESWL);  Surgeon: WRoyston Cowper MD;  Location: [facility];  Service: Urology;  Laterality: Right;   EXTRACORPOREAL SHOCK WAVE LITHOTRIPSY Left  11/09/2017   Procedure: EXTRACORPOREAL SHOCK WAVE LITHOTRIPSY (ESWL);  Surgeon: Royston Cowper, MD;  Location: [facility];  Service: Urology;  Laterality: Left;   FOOT SURGERY Right 1995   HOLMIUM LASER APPLICATION Right [id number]   Procedure: HOLMIUM LASER APPLICATION;  Surgeon: Royston Cowper, MD;  Location: [facility];  Service: Urology;  Laterality: Right;   URETERAL STENT PLACEMENT  12/2016   URETEROLITHOTOMY  2008   URETEROSCOPY WITH  HOLMIUM LASER LITHOTRIPSY Right 01/03/2017   Procedure: URETEROSCOPY WITH HOLMIUM LASER LITHOTRIPSY;  Surgeon: Royston Cowper, MD;  Location: [facility];  Service: Urology;  Laterality: Right;   URETEROSCOPY WITH HOLMIUM LASER LITHOTRIPSY Bilateral 08/28/2018   Procedure: URETEROSCOPY WITH HOLMIUM LASER LITHOTRIPSY;  Surgeon: Royston Cowper, MD;  Location: [facility];  Service: Urology;  Laterality: Bilateral;    Social History   Tobacco Use   Smoking status: Former    Packs/day: 1.00    Years: 5.00    Total pack years: 5.00    Types: Cigarettes    Quit date: 12/20/1993    Years since quitting: 28.7   Smokeless tobacco: Current    Types: Snuff  Vaping Use   Vaping Use: Never used  Substance Use Topics   Alcohol use: Yes    Alcohol/week: 4.0 standard drinks of alcohol    Types: 4 Standard drinks or equivalent per week    Comment: occasionally   Drug use: Not Currently     Medication list has been reviewed and updated.  Current Meds  Medication Sig   fluticasone (FLONASE) 50 MCG/ACT nasal spray Place into both nostrils as needed for allergies or rhinitis.       08/29/2022    7:55 AM 08/25/2021    7:59 AM 07/15/2020    7:51 AM  GAD 7 : Generalized Anxiety Score  Nervous, Anxious, on Edge 0 0 0  Control/stop worrying 0 0 0  Worry too much - different things 0 0 0  Trouble relaxing 0 0 0  Restless 0 0 0  Easily annoyed or irritable 0 0 0  Afraid - awful might happen 0 0 0  Total GAD 7 Score 0 0 0  Anxiety Difficulty Not difficult at all Not difficult at all Not difficult at all       08/29/2022    7:55 AM 08/25/2021    7:59 AM 07/15/2020    7:51 AM  Depression screen PHQ 2/9  Decreased Interest 0 0 0  Down, Depressed, Hopeless 0 0 0  PHQ - 2 Score 0 0 0  Altered sleeping 0 0 0  Tired, decreased energy 0 0 0  Change in appetite 0 0 0  Feeling bad or failure about yourself  0 0 0  Trouble concentrating 0 0 0  Moving slowly or fidgety/restless 0 0 0  Suicidal  thoughts 0 0 0  PHQ-9 Score 0 0 0  Difficult doing work/chores Not difficult at all Not difficult at all Not difficult at all    BP Readings from Last 3 Encounters:  08/29/22 120/78  08/25/21 130/78  07/15/20 132/88    Physical Exam Vitals and nursing note reviewed.  Constitutional:      Appearance: Normal appearance. He is well-developed.  HENT:     Head: Normocephalic.     Right Ear: Tympanic membrane, ear canal and external ear normal.     Left Ear: Tympanic membrane, ear canal and external ear normal.     Nose: Nose normal.  Eyes:  Conjunctiva/sclera: Conjunctivae normal.     Pupils: Pupils are equal, round, and reactive to light.  Neck:     Thyroid: No thyromegaly.     Vascular: No carotid bruit.  Cardiovascular:     Rate and Rhythm: Normal rate and regular rhythm.     Heart sounds: Normal heart sounds.  Pulmonary:     Effort: Pulmonary effort is normal.     Breath sounds: Normal breath sounds. No wheezing.  Chest:  Breasts:    Right: No mass.     Left: No mass.  Abdominal:     General: Bowel sounds are normal.     Palpations: Abdomen is soft.     Tenderness: There is no abdominal tenderness.  Musculoskeletal:        General: Normal range of motion.     Cervical back: Normal range of motion and neck supple.  Lymphadenopathy:     Cervical: No cervical adenopathy.  Skin:    General: Skin is warm and dry.  Neurological:     Mental Status: He is alert and oriented to person, place, and time.     Deep Tendon Reflexes: Reflexes are normal and symmetric.  Psychiatric:        Attention and Perception: Attention normal.        Mood and Affect: Mood normal.        Thought Content: Thought content normal.     Wt Readings from Last 3 Encounters:  08/29/22 229 lb (103.9 kg)  08/25/21 219 lb (99.3 kg)  07/15/20 253 lb (114.8 kg)    BP 120/78   Pulse 74   Wt 229 lb (103.9 kg)   SpO2 99%   BMI 32.86 kg/m   Assessment and Plan: Problem List Items  Addressed This Visit       Genitourinary   Kidney stones    History of recurrent stones Taking over the counter supplement for prevention        Other   Gilbert syndrome   Relevant Orders   Comprehensive metabolic panel   Other Visit Diagnoses     Annual physical exam    -  Primary   Continue healthy diet, exercise and weight management He declines Shingrix and Flu vaccine   Relevant Orders   CBC with Differential/Platelet   Comprehensive metabolic panel   Lipid panel   Hemoglobin A1c   PSA   Colon cancer screening       Relevant Orders   Fecal occult blood, imunochemical   Prostate cancer screening       Relevant Orders   PSA   Screening for diabetes mellitus       Relevant Orders   Hemoglobin A1c   Screening for lipid disorders       Relevant Orders   Lipid panel        Partially dictated using Dragon software. Any errors are unintentional.  Halina Maidens, MD [facility]  08/29/2022

## 2022-08-30 LAB — CBC WITH DIFFERENTIAL/PLATELET
Basophils Absolute: 0 10*3/uL (ref 0.0–0.2)
Basos: 1 %
EOS (ABSOLUTE): 0.1 10*3/uL (ref 0.0–0.4)
Eos: 2 %
Hematocrit: 44.6 % (ref 37.5–51.0)
Hemoglobin: 15.3 g/dL (ref 13.0–17.7)
Immature Grans (Abs): 0 10*3/uL (ref 0.0–0.1)
Immature Granulocytes: 0 %
Lymphocytes Absolute: 1.3 10*3/uL (ref 0.7–3.1)
Lymphs: 35 %
MCH: 33.1 pg — ABNORMAL HIGH (ref 26.6–33.0)
MCHC: 34.3 g/dL (ref 31.5–35.7)
MCV: 97 fL (ref 79–97)
Monocytes Absolute: 0.3 10*3/uL (ref 0.1–0.9)
Monocytes: 9 %
Neutrophils Absolute: 2 10*3/uL (ref 1.4–7.0)
Neutrophils: 53 %
Platelets: 263 10*3/uL (ref 150–450)
RBC: 4.62 x10E6/uL (ref 4.14–5.80)
RDW: 12.2 % (ref 11.6–15.4)
WBC: 3.7 10*3/uL (ref 3.4–10.8)

## 2022-08-30 LAB — COMPREHENSIVE METABOLIC PANEL
ALT: 22 IU/L (ref 0–44)
AST: 16 IU/L (ref 0–40)
Albumin/Globulin Ratio: 2 (ref 1.2–2.2)
Albumin: 4.3 g/dL (ref 3.8–4.9)
Alkaline Phosphatase: 53 IU/L (ref 44–121)
BUN/Creatinine Ratio: 16 (ref 9–20)
BUN: 16 mg/dL (ref 6–24)
Bilirubin Total: 2 mg/dL — ABNORMAL HIGH (ref 0.0–1.2)
CO2: 23 mmol/L (ref 20–29)
Calcium: 9.3 mg/dL (ref 8.7–10.2)
Chloride: 104 mmol/L (ref 96–106)
Creatinine, Ser: 1.03 mg/dL (ref 0.76–1.27)
Globulin, Total: 2.2 g/dL (ref 1.5–4.5)
Glucose: 108 mg/dL — ABNORMAL HIGH (ref 70–99)
Potassium: 5.5 mmol/L — ABNORMAL HIGH (ref 3.5–5.2)
Sodium: 139 mmol/L (ref 134–144)
Total Protein: 6.5 g/dL (ref 6.0–8.5)
eGFR: 87 mL/min/{1.73_m2} (ref >59)

## 2022-08-30 LAB — HEMOGLOBIN A1C
Est. average glucose Bld gHb Est-mCnc: 97 mg/dL
Hgb A1c MFr Bld: 5 % (ref 4.8–5.6)

## 2022-08-30 LAB — LIPID PANEL
Chol/HDL Ratio: 2.1 ratio (ref 0.0–5.0)
Cholesterol, Total: 134 mg/dL (ref 100–199)
HDL: 63 mg/dL (ref >39)
LDL Chol Calc (NIH): 62 mg/dL (ref 0–99)
Triglycerides: 37 mg/dL (ref 0–149)
VLDL Cholesterol Cal: 9 mg/dL (ref 5–40)

## 2022-08-30 LAB — PSA: Prostate Specific Ag, Serum: 1.5 ng/mL (ref 0.0–4.0)

## 2022-12-23 DIAGNOSIS — G5761 Lesion of plantar nerve, right lower limb: Secondary | ICD-10-CM | POA: Diagnosis not present

## 2023-01-23 DIAGNOSIS — M25541 Pain in joints of right hand: Secondary | ICD-10-CM | POA: Diagnosis not present

## 2023-01-23 DIAGNOSIS — M67441 Ganglion, right hand: Secondary | ICD-10-CM | POA: Diagnosis not present

## 2023-03-19 HISTORY — PX: HAND SURGERY: SHX662

## 2023-04-14 DIAGNOSIS — M67441 Ganglion, right hand: Secondary | ICD-10-CM | POA: Diagnosis not present

## 2023-09-07 ENCOUNTER — Encounter: Payer: Self-pay | Admitting: Internal Medicine

## 2023-09-07 ENCOUNTER — Ambulatory Visit (INDEPENDENT_AMBULATORY_CARE_PROVIDER_SITE_OTHER): Payer: 59 | Admitting: Internal Medicine

## 2023-09-07 VITALS — BP 124/78 | HR 74 | Ht 70.0 in | Wt 228.0 lb

## 2023-09-07 DIAGNOSIS — Z13220 Encounter for screening for lipoid disorders: Secondary | ICD-10-CM

## 2023-09-07 DIAGNOSIS — Z0000 Encounter for general adult medical examination without abnormal findings: Secondary | ICD-10-CM

## 2023-09-07 DIAGNOSIS — Z125 Encounter for screening for malignant neoplasm of prostate: Secondary | ICD-10-CM

## 2023-09-07 DIAGNOSIS — R002 Palpitations: Secondary | ICD-10-CM | POA: Insufficient documentation

## 2023-09-07 DIAGNOSIS — Z1211 Encounter for screening for malignant neoplasm of colon: Secondary | ICD-10-CM

## 2023-09-07 DIAGNOSIS — R739 Hyperglycemia, unspecified: Secondary | ICD-10-CM

## 2023-09-07 NOTE — Progress Notes (Signed)
 Date:  09/07/2023   Name:  John Bauer   DOB:  02-23-1971   MRN:  [id number]   Chief Complaint: Annual Exam John Bauer is a 53 y.o. male who presents today for his Complete Annual Exam. He feels well. He reports exercising. He reports he is sleeping well.   Health Maintenance  Topic Date Due   Colon Cancer Screening  Never done   Stool Blood Test  09/06/2022   COVID-19 Vaccine (3 - 2024-25 season) 09/23/2023*   Flu Shot  10/16/2023*   Zoster (Shingles) Vaccine (1 of 2) 12/05/2023*   DTaP/Tdap/Td vaccine (1 - Tdap) 09/06/2024*   HIV Screening  09/06/2024*   Hepatitis C Screening  Completed   HPV Vaccine  Aged Out  *Topic was postponed. The date shown is not the original due date.    Lab Results  Component Value Date   PSA1 1.5 08/29/2022   PSA1 0.9 08/25/2021   PSA1 0.8 07/15/2020    Palpitations  This is a recurrent problem. The problem occurs intermittently. On average, each episode lasts 5 seconds. Pertinent negatives include no anxiety, chest pain, diaphoresis, dizziness or shortness of breath. He has tried nothing for the symptoms.    Review of Systems  Constitutional:  Negative for appetite change, chills, diaphoresis, fatigue and unexpected weight change.  HENT:  Negative for hearing loss, tinnitus, trouble swallowing and voice change.   Eyes:  Negative for visual disturbance.  Respiratory:  Negative for choking, shortness of breath and wheezing.   Cardiovascular:  Positive for palpitations. Negative for chest pain and leg swelling.  Gastrointestinal:  Negative for abdominal pain, blood in stool, constipation and diarrhea.  Genitourinary:  Negative for difficulty urinating, dysuria and frequency.  Musculoskeletal:  Positive for arthralgias (mild hand stiffness s/p surgery last year). Negative for back pain and myalgias.  Skin:  Negative for color change and rash.  Neurological:  Negative for dizziness, syncope and headaches.  Hematological:  Negative  for adenopathy.  Psychiatric/Behavioral:  Negative for dysphoric mood and sleep disturbance. The patient is not nervous/anxious.      Lab Results  Component Value Date   NA 139 08/29/2022   K 5.5 (H) 08/29/2022   CO2 23 08/29/2022   GLUCOSE 108 (H) 08/29/2022   BUN 16 08/29/2022   CREATININE 1.03 08/29/2022   CALCIUM 9.3 08/29/2022   EGFR 87 08/29/2022   GFRNONAA 84 07/15/2020   Lab Results  Component Value Date   CHOL 134 08/29/2022   HDL 63 08/29/2022   LDLCALC 62 08/29/2022   TRIG 37 08/29/2022   CHOLHDL 2.1 08/29/2022   Lab Results  Component Value Date   TSH 1.610 07/06/2018   Lab Results  Component Value Date   HGBA1C 5.0 08/29/2022   Lab Results  Component Value Date   WBC 3.7 08/29/2022   HGB 15.3 08/29/2022   HCT 44.6 08/29/2022   MCV 97 08/29/2022   PLT 263 08/29/2022   Lab Results  Component Value Date   ALT 22 08/29/2022   AST 16 08/29/2022   ALKPHOS 53 08/29/2022   BILITOT 2.0 (H) 08/29/2022   No results found for: "25OHVITD2", "25OHVITD3", "VD25OH"   Patient Active Problem List   Diagnosis Date Noted   Palpitations 09/07/2023   Lateral epicondylitis of right elbow 07/15/2020   Recurrent sinus infections 07/06/2018   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   History of smoking 02/13/2018   Kidney stones 02/13/2018   Chronic fatigue 02/13/2018    No Known  Allergies  Past Surgical History:  Procedure Laterality Date   CHOLECYSTECTOMY  age 53's   CYSTOSCOPY W/ RETROGRADES Bilateral 08/28/2018   Procedure: CYSTOSCOPY WITH RETROGRADE PYELOGRAM;  Surgeon: Orson Ape, MD;  Location: [facility];  Service: Urology;  Laterality: Bilateral;   EXTRACORPOREAL SHOCK WAVE LITHOTRIPSY Right 01/26/2017   Procedure: EXTRACORPOREAL SHOCK WAVE LITHOTRIPSY (ESWL);  Surgeon: Orson Ape, MD;  Location: [facility];  Service: Urology;  Laterality: Right;   EXTRACORPOREAL SHOCK WAVE LITHOTRIPSY Left 11/09/2017   Procedure: EXTRACORPOREAL SHOCK WAVE LITHOTRIPSY  (ESWL);  Surgeon: Orson Ape, MD;  Location: [facility];  Service: Urology;  Laterality: Left;   FOOT SURGERY Right 1995   HAND SURGERY Right 03/2023   HOLMIUM LASER APPLICATION Right 01/03/2017   Procedure: HOLMIUM LASER APPLICATION;  Surgeon: Orson Ape, MD;  Location: [facility];  Service: Urology;  Laterality: Right;   URETERAL STENT PLACEMENT  12/2016   URETEROLITHOTOMY  2008   URETEROSCOPY WITH HOLMIUM LASER LITHOTRIPSY Right 01/03/2017   Procedure: URETEROSCOPY WITH HOLMIUM LASER LITHOTRIPSY;  Surgeon: Orson Ape, MD;  Location: [facility];  Service: Urology;  Laterality: Right;   URETEROSCOPY WITH HOLMIUM LASER LITHOTRIPSY Bilateral 08/28/2018   Procedure: URETEROSCOPY WITH HOLMIUM LASER LITHOTRIPSY;  Surgeon: Orson Ape, MD;  Location: [facility];  Service: Urology;  Laterality: Bilateral;    Social History   Tobacco Use   Smoking status: Former    Current packs/day: 0.00    Average packs/day: 1 pack/day for 5.0 years (5.0 ttl pk-yrs)    Types: Cigarettes    Start date: 12/20/1988    Quit date: 12/20/1993    Years since quitting: 29.7   Smokeless tobacco: Current    Types: Snuff  Vaping Use   Vaping status: Never Used  Substance Use Topics   Alcohol use: Yes    Alcohol/week: 4.0 standard drinks of alcohol    Types: 4 Standard drinks or equivalent per week    Comment: occasionally   Drug use: Never     Medication list has been reviewed and updated.  Current Meds  Medication Sig   fluticasone (FLONASE) 50 MCG/ACT nasal spray Place into both nostrils as needed for allergies or rhinitis.   UNABLE TO FIND Med Name: Kidneycop       09/07/2023   10:07 AM 08/29/2022    7:55 AM 08/25/2021    7:59 AM 07/15/2020    7:51 AM  GAD 7 : Generalized Anxiety Score  Nervous, Anxious, on Edge 0 0 0 0  Control/stop worrying 0 0 0 0  Worry too much - different things 0 0 0 0  Trouble relaxing 0 0 0 0  Restless 0 0 0 0  Easily annoyed or irritable 0 0 0 0  Afraid -  awful might happen 0 0 0 0  Total GAD 7 Score 0 0 0 0  Anxiety Difficulty Not difficult at all Not difficult at all Not difficult at all Not difficult at all       09/07/2023   10:07 AM 08/29/2022    7:55 AM 08/25/2021    7:59 AM  Depression screen PHQ 2/9  Decreased Interest 0 0 0  Down, Depressed, Hopeless 0 0 0  PHQ - 2 Score 0 0 0  Altered sleeping 0 0 0  Tired, decreased energy 0 0 0  Change in appetite 0 0 0  Feeling bad or failure about yourself  0 0 0  Trouble concentrating 0 0 0  Moving slowly  or fidgety/restless 0 0 0  Suicidal thoughts 0 0 0  PHQ-9 Score 0 0 0  Difficult doing work/chores Not difficult at all Not difficult at all Not difficult at all    BP Readings from Last 3 Encounters:  09/07/23 124/78  08/29/22 120/78  08/25/21 130/78    Physical Exam Vitals and nursing note reviewed.  Constitutional:      Appearance: Normal appearance. He is well-developed.  HENT:     Head: Normocephalic.     Right Ear: Tympanic membrane, ear canal and external ear normal.     Left Ear: Tympanic membrane, ear canal and external ear normal.     Nose: Nose normal.  Eyes:     Conjunctiva/sclera: Conjunctivae normal.     Pupils: Pupils are equal, round, and reactive to light.  Neck:     Thyroid: No thyromegaly.     Vascular: No carotid bruit.  Cardiovascular:     Rate and Rhythm: Normal rate and regular rhythm. Occasional Extrasystoles are present.    Pulses: Normal pulses.     Heart sounds: Normal heart sounds. No murmur heard. Pulmonary:     Effort: Pulmonary effort is normal.     Breath sounds: Normal breath sounds. No wheezing.  Chest:  Breasts:    Right: No mass.     Left: No mass.  Abdominal:     General: Bowel sounds are normal.     Palpations: Abdomen is soft.     Tenderness: There is no abdominal tenderness.  Musculoskeletal:        General: Normal range of motion.     Cervical back: Normal range of motion and neck supple.     Right lower leg: No edema.      Left lower leg: No edema.  Lymphadenopathy:     Cervical: No cervical adenopathy.  Skin:    General: Skin is warm and dry.  Neurological:     General: No focal deficit present.     Mental Status: He is alert and oriented to person, place, and time.     Deep Tendon Reflexes: Reflexes are normal and symmetric.  Psychiatric:        Attention and Perception: Attention normal.        Mood and Affect: Mood normal.        Thought Content: Thought content normal.        Cognition and Memory: Cognition normal.     Wt Readings from Last 3 Encounters:  09/07/23 228 lb (103.4 kg)  08/29/22 229 lb (103.9 kg)  08/25/21 219 lb (99.3 kg)    BP 124/78   Pulse 74   Ht 5\' 10"  (1.778 m)   Wt 228 lb (103.4 kg)   SpO2 99%   BMI 32.71 kg/m   Assessment and Plan:  Problem List Items Addressed This Visit       Unprioritized   Gilbert syndrome   Relevant Orders   Comprehensive metabolic panel   Palpitations   Occasional palpitations unchanged; no associated worrisome symptoms      Other Visit Diagnoses       Annual physical exam    -  Primary   He declines all vaccines continue healthy diet, exercise   Relevant Orders   CBC with Differential/Platelet   Comprehensive metabolic panel   Hemoglobin A1c   Lipid panel   PSA     Prostate cancer screening       Relevant Orders   PSA     Colon  cancer screening       he submitted FIT last year but it was never results will repeat annually - he declines colonoscopy   Relevant Orders   Fecal occult blood, imunochemical     Screening for lipid disorders       Relevant Orders   Lipid panel     Elevated serum glucose       Relevant Orders   Comprehensive metabolic panel   Hemoglobin A1c       Return in about 1 year (around 09/06/2024) for CPX with new provider.    Reubin Milan, MD [facility] [place]

## 2023-09-07 NOTE — Assessment & Plan Note (Signed)
 Occasional palpitations unchanged; no associated worrisome symptoms

## 2023-09-08 LAB — CBC WITH DIFFERENTIAL/PLATELET
Basophils Absolute: 0 10*3/uL (ref 0.0–0.2)
Basos: 1 %
EOS (ABSOLUTE): 0 10*3/uL (ref 0.0–0.4)
Eos: 1 %
Hematocrit: 49.1 % (ref 37.5–51.0)
Hemoglobin: 16.7 g/dL (ref 13.0–17.7)
Immature Grans (Abs): 0 10*3/uL (ref 0.0–0.1)
Immature Granulocytes: 0 %
Lymphocytes Absolute: 1.8 10*3/uL (ref 0.7–3.1)
Lymphs: 32 %
MCH: 33.9 pg — ABNORMAL HIGH (ref 26.6–33.0)
MCHC: 34 g/dL (ref 31.5–35.7)
MCV: 100 fL — ABNORMAL HIGH (ref 79–97)
Monocytes Absolute: 0.5 10*3/uL (ref 0.1–0.9)
Monocytes: 9 %
Neutrophils Absolute: 3.1 10*3/uL (ref 1.4–7.0)
Neutrophils: 57 %
Platelets: 299 10*3/uL (ref 150–450)
RBC: 4.93 x10E6/uL (ref 4.14–5.80)
RDW: 12.6 % (ref 11.6–15.4)
WBC: 5.5 10*3/uL (ref 3.4–10.8)

## 2023-09-08 LAB — COMPREHENSIVE METABOLIC PANEL
ALT: 26 [IU]/L (ref 0–44)
AST: 16 [IU]/L (ref 0–40)
Albumin: 4.5 g/dL (ref 3.8–4.9)
Alkaline Phosphatase: 56 [IU]/L (ref 44–121)
BUN/Creatinine Ratio: 20 (ref 9–20)
BUN: 21 mg/dL (ref 6–24)
Bilirubin Total: 2 mg/dL — ABNORMAL HIGH (ref 0.0–1.2)
CO2: 24 mmol/L (ref 20–29)
Calcium: 9.7 mg/dL (ref 8.7–10.2)
Chloride: 104 mmol/L (ref 96–106)
Creatinine, Ser: 1.04 mg/dL (ref 0.76–1.27)
Globulin, Total: 2.4 g/dL (ref 1.5–4.5)
Glucose: 94 mg/dL (ref 70–99)
Potassium: 5.2 mmol/L (ref 3.5–5.2)
Sodium: 141 mmol/L (ref 134–144)
Total Protein: 6.9 g/dL (ref 6.0–8.5)
eGFR: 86 mL/min/{1.73_m2} (ref >59)

## 2023-09-08 LAB — LIPID PANEL
Chol/HDL Ratio: 2.6 {ratio} (ref 0.0–5.0)
Cholesterol, Total: 151 mg/dL (ref 100–199)
HDL: 58 mg/dL (ref >39)
LDL Chol Calc (NIH): 77 mg/dL (ref 0–99)
Triglycerides: 87 mg/dL (ref 0–149)
VLDL Cholesterol Cal: 16 mg/dL (ref 5–40)

## 2023-09-08 LAB — HEMOGLOBIN A1C
Est. average glucose Bld gHb Est-mCnc: 100 mg/dL
Hgb A1c MFr Bld: 5.1 % (ref 4.8–5.6)

## 2023-09-08 LAB — PSA: Prostate Specific Ag, Serum: 1.9 ng/mL (ref 0.0–4.0)

## 2023-09-13 LAB — FECAL OCCULT BLOOD, IMMUNOCHEMICAL: Fecal Occult Bld: NEGATIVE

## 2024-09-09 ENCOUNTER — Ambulatory Visit: Payer: 59 | Admitting: Student
